# Patient Record
Sex: Female | Born: 1945 | Race: White | Hispanic: No | Marital: Married | Smoking: Former smoker
Health system: Southern US, Community
[De-identification: ages and names within clinical notes are randomized; demographics above are authoritative.]

## PROBLEM LIST (undated history)

## (undated) DIAGNOSIS — Z9889 Other specified postprocedural states: Secondary | ICD-10-CM

## (undated) DIAGNOSIS — Z923 Personal history of irradiation: Secondary | ICD-10-CM

## (undated) DIAGNOSIS — R112 Nausea with vomiting, unspecified: Secondary | ICD-10-CM

## (undated) DIAGNOSIS — Z860100 Personal history of colon polyps, unspecified: Secondary | ICD-10-CM

## (undated) DIAGNOSIS — Z87442 Personal history of urinary calculi: Secondary | ICD-10-CM

## (undated) DIAGNOSIS — Z803 Family history of malignant neoplasm of breast: Secondary | ICD-10-CM

## (undated) DIAGNOSIS — M2550 Pain in unspecified joint: Secondary | ICD-10-CM

## (undated) DIAGNOSIS — D0512 Intraductal carcinoma in situ of left breast: Secondary | ICD-10-CM

## (undated) DIAGNOSIS — M2540 Effusion, unspecified joint: Secondary | ICD-10-CM

## (undated) DIAGNOSIS — E039 Hypothyroidism, unspecified: Secondary | ICD-10-CM

## (undated) DIAGNOSIS — M1990 Unspecified osteoarthritis, unspecified site: Secondary | ICD-10-CM

## (undated) DIAGNOSIS — Z800 Family history of malignant neoplasm of digestive organs: Secondary | ICD-10-CM

## (undated) HISTORY — DX: Family history of malignant neoplasm of digestive organs: Z80.0

## (undated) HISTORY — DX: Family history of malignant neoplasm of breast: Z80.3

## (undated) HISTORY — DX: Intraductal carcinoma in situ of left breast: D05.12

## (undated) HISTORY — PX: TONSILLECTOMY: SUR1361

## (undated) HISTORY — PX: DILATION AND CURETTAGE OF UTERUS: SHX78

## (undated) HISTORY — PX: BREAST LUMPECTOMY: SHX2

---

## 2009-07-05 HISTORY — PX: KIDNEY DONATION: SHX685

## 2012-01-25 ENCOUNTER — Other Ambulatory Visit: Payer: Self-pay | Admitting: Internal Medicine

## 2012-01-25 DIAGNOSIS — Z1231 Encounter for screening mammogram for malignant neoplasm of breast: Secondary | ICD-10-CM

## 2012-02-03 ENCOUNTER — Ambulatory Visit
Admission: RE | Admit: 2012-02-03 | Discharge: 2012-02-03 | Disposition: A | Discharge status: Home / Self Care | Payer: Medicare Other | Source: Ambulatory Visit | Attending: Internal Medicine | Admitting: Internal Medicine

## 2012-02-03 DIAGNOSIS — Z1231 Encounter for screening mammogram for malignant neoplasm of breast: Secondary | ICD-10-CM

## 2012-02-04 ENCOUNTER — Other Ambulatory Visit: Payer: Self-pay | Admitting: Internal Medicine

## 2012-02-04 DIAGNOSIS — R928 Other abnormal and inconclusive findings on diagnostic imaging of breast: Secondary | ICD-10-CM

## 2013-10-10 ENCOUNTER — Other Ambulatory Visit: Payer: Self-pay

## 2013-10-10 DIAGNOSIS — Z1231 Encounter for screening mammogram for malignant neoplasm of breast: Secondary | ICD-10-CM

## 2013-10-31 ENCOUNTER — Ambulatory Visit
Admission: RE | Admit: 2013-10-31 | Discharge: 2013-10-31 | Disposition: A | Discharge status: Home / Self Care | Payer: Medicare Other | Source: Ambulatory Visit

## 2013-10-31 DIAGNOSIS — Z1231 Encounter for screening mammogram for malignant neoplasm of breast: Secondary | ICD-10-CM

## 2013-11-08 ENCOUNTER — Other Ambulatory Visit: Payer: Self-pay | Admitting: Internal Medicine

## 2013-11-08 DIAGNOSIS — R928 Other abnormal and inconclusive findings on diagnostic imaging of breast: Secondary | ICD-10-CM

## 2013-12-08 ENCOUNTER — Ambulatory Visit
Admission: RE | Admit: 2013-12-08 | Discharge: 2013-12-08 | Disposition: A | Discharge status: Home / Self Care | Payer: Medicare Other | Source: Ambulatory Visit | Attending: Internal Medicine | Admitting: Internal Medicine

## 2013-12-08 DIAGNOSIS — R928 Other abnormal and inconclusive findings on diagnostic imaging of breast: Secondary | ICD-10-CM

## 2014-02-14 ENCOUNTER — Other Ambulatory Visit: Payer: Self-pay | Admitting: Gastroenterology

## 2014-04-30 ENCOUNTER — Encounter (HOSPITAL_COMMUNITY): Payer: Self-pay | Admitting: Pharmacy Technician

## 2014-05-10 ENCOUNTER — Encounter (HOSPITAL_COMMUNITY): Payer: Self-pay | Admitting: *Deleted

## 2014-05-21 ENCOUNTER — Other Ambulatory Visit: Payer: Self-pay | Admitting: Gastroenterology

## 2014-05-22 ENCOUNTER — Encounter (HOSPITAL_COMMUNITY)
Admission: RE | Disposition: A | Discharge status: Home / Self Care | Payer: Self-pay | Source: Ambulatory Visit | Attending: Gastroenterology

## 2014-05-22 SURGERY — COLONOSCOPY WITH PROPOFOL
Anesthesia: Monitor Anesthesia Care

## 2014-05-22 MED ORDER — PROPOFOL 10 MG/ML IV BOLUS
INTRAVENOUS | Status: AC
  Filled 2014-05-22: qty 20

## 2014-05-22 MED ORDER — LIDOCAINE HCL (CARDIAC) 20 MG/ML IV SOLN
INTRAVENOUS | Status: AC
  Filled 2014-05-22: qty 5

## 2014-05-22 NOTE — Anesthesia Preprocedure Evaluation (Addendum)
Anesthesia Evaluation  Patient identified by MRN, date of birth, ID band Patient awake    Reviewed: Allergy & Precautions, H&P , NPO status , Patient's Chart, lab work & pertinent test results  History of Anesthesia Complications Negative for: history of anesthetic complications  Airway Mallampati: II  TM Distance: >3 FB Neck ROM: Full    Dental no notable dental hx.    Pulmonary neg pulmonary ROS,  breath sounds clear to auscultation  Pulmonary exam normal       Cardiovascular negative cardio ROS  Rhythm:Regular Rate:Normal     Neuro/Psych negative neurological ROS  negative psych ROS   GI/Hepatic negative GI ROS, Neg liver ROS,   Endo/Other  Hypothyroidism   Renal/GU negative Renal ROS  negative genitourinary   Musculoskeletal  (+) Arthritis -, Osteoarthritis,    Abdominal   Peds negative pediatric ROS (+)  Hematology negative hematology ROS (+)   Anesthesia Other Findings   Reproductive/Obstetrics negative OB ROS                             Anesthesia Physical Anesthesia Plan  ASA: II  Anesthesia Plan: MAC   Post-op Pain Management:    Induction: Intravenous  Airway Management Planned: Nasal Cannula  Additional Equipment:   Intra-op Plan:   Post-operative Plan:   Informed Consent: I have reviewed the patients History and Physical, chart, labs and discussed the procedure including the risks, benefits and alternatives for the proposed anesthesia with the patient or authorized representative who has indicated his/her understanding and acceptance.   Dental advisory given  Plan Discussed with: CRNA and Surgeon  Anesthesia Plan Comments:         Anesthesia Quick Evaluation

## 2014-08-15 ENCOUNTER — Encounter (HOSPITAL_COMMUNITY): Payer: Self-pay | Admitting: *Deleted

## 2014-09-04 ENCOUNTER — Encounter (HOSPITAL_COMMUNITY): Payer: Self-pay | Admitting: Gastroenterology

## 2014-09-04 ENCOUNTER — Ambulatory Visit (HOSPITAL_COMMUNITY): Payer: Medicare Other | Admitting: Registered Nurse

## 2014-09-04 ENCOUNTER — Encounter (HOSPITAL_COMMUNITY)
Admission: RE | Disposition: A | Discharge status: Home / Self Care | Payer: Self-pay | Source: Ambulatory Visit | Attending: Gastroenterology

## 2014-09-04 ENCOUNTER — Ambulatory Visit (HOSPITAL_COMMUNITY)
Admission: RE | Admit: 2014-09-04 | Discharge: 2014-09-04 | Disposition: A | Discharge status: Home / Self Care | Payer: Medicare Other | Source: Ambulatory Visit | Attending: Gastroenterology | Admitting: Gastroenterology

## 2014-09-04 ENCOUNTER — Other Ambulatory Visit: Payer: Self-pay | Admitting: Gastroenterology

## 2014-09-04 DIAGNOSIS — Z1211 Encounter for screening for malignant neoplasm of colon: Secondary | ICD-10-CM | POA: Diagnosis present

## 2014-09-04 DIAGNOSIS — E039 Hypothyroidism, unspecified: Secondary | ICD-10-CM | POA: Insufficient documentation

## 2014-09-04 DIAGNOSIS — M1990 Unspecified osteoarthritis, unspecified site: Secondary | ICD-10-CM | POA: Insufficient documentation

## 2014-09-04 DIAGNOSIS — Z86010 Personal history of colonic polyps: Secondary | ICD-10-CM | POA: Diagnosis not present

## 2014-09-04 HISTORY — DX: Unspecified osteoarthritis, unspecified site: M19.90

## 2014-09-04 HISTORY — DX: Hypothyroidism, unspecified: E03.9

## 2014-09-04 HISTORY — PX: COLONOSCOPY WITH PROPOFOL: SHX5780

## 2014-09-04 SURGERY — COLONOSCOPY WITH PROPOFOL
Anesthesia: Monitor Anesthesia Care

## 2014-09-04 MED ORDER — FENTANYL CITRATE 0.05 MG/ML IJ SOLN
INTRAMUSCULAR | Status: DC | PRN
  Administered 2014-09-04: 100 ug via INTRAVENOUS

## 2014-09-04 MED ORDER — PROPOFOL INFUSION 10 MG/ML OPTIME
INTRAVENOUS | Status: DC | PRN
  Administered 2014-09-04: 140 ug/kg/min via INTRAVENOUS

## 2014-09-04 MED ORDER — LACTATED RINGERS IV SOLN
INTRAVENOUS | Status: DC
  Administered 2014-09-04: 1000 mL via INTRAVENOUS

## 2014-09-04 MED ORDER — KETAMINE HCL 10 MG/ML IJ SOLN
INTRAMUSCULAR | Status: DC | PRN
  Administered 2014-09-04: 10 mg via INTRAVENOUS

## 2014-09-04 MED ORDER — NALOXONE HCL 0.4 MG/ML IJ SOLN
INTRAMUSCULAR | Status: DC | PRN
Start: 2014-09-04 — End: 2014-09-04
  Administered 2014-09-04: .08 mg via INTRAVENOUS

## 2014-09-04 MED ORDER — FENTANYL CITRATE 0.05 MG/ML IJ SOLN
INTRAMUSCULAR | Status: AC
  Filled 2014-09-04: qty 2

## 2014-09-04 SURGICAL SUPPLY — 21 items

## 2014-09-04 NOTE — Op Note (Signed)
Procedure: Surveillance colonoscopy. Colonoscopy with removal of 2 small adenomatous colon polyps performed on 05/13/2009  Endoscopist: Earle Gell  Premedication: Propofol administered by anesthesia  Procedure: The patient was placed in the left lateral decubitus position. Anal inspection and digital rectal exam were normal. The Pentax pediatric colonoscope was introduced into the rectum and advanced to the cecum. A normal-appearing appendiceal orifice was identified. A normal-appearing ileocecal valve was identified. Colonic preparation for the exam today was good. Withdrawal time was 11 minutes  Rectum. Normal. Retroflexed view of the distal rectum normal  Sigmoid colon and descending colon. Normal  Splenic flexure. Normal  Transverse colon. Normal  Hepatic flexure. Normal  Ascending colon. Normal  Cecum and ileocecal valve. Normal  Assessment: Normal surveillance colonoscopy  Recommendation: Schedule repeat surveillance colonoscopy in 5 years

## 2014-09-04 NOTE — Transfer of Care (Signed)
Immediate Anesthesia Transfer of Care Note  Patient: Barbara Ferguson  Procedure(s) Performed: Procedure(s): COLONOSCOPY WITH PROPOFOL (N/A)  Patient Location: PACU and Endoscopy Unit  Anesthesia Type:MAC  Level of Consciousness: awake, alert  and oriented  Airway & Oxygen Therapy: Patient Spontanous Breathing  Post-op Assessment: Report given to PACU RN and Post -op Vital signs reviewed and stable  Post vital signs: Reviewed and stable  Complications: No apparent anesthesia complications

## 2014-09-04 NOTE — H&P (Signed)
  Procedure: Surveillance colonoscopy. Colonoscopy with removal of 2 small adenomatous colon polyps performed on 05/13/2009  History: The patient is a 68 year old female born June 09, 1946. She is scheduled to undergo surveillance colonoscopy with polypectomy to prevent colon cancer.  Past medical history: Tonsillectomy. 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 Hypothyroidism.  Exam: The patient is alert and lying comfortably on the endoscopy stretcher. Abdomen is soft and nontender to palpation. Lungs are clear to auscultation. Cardiac exam reveals a regular rhythm.  Plan: Proceed with surveillance colonoscopy

## 2014-09-04 NOTE — Anesthesia Postprocedure Evaluation (Signed)
  Anesthesia Post-op Note  Patient: Barbara Ferguson  Procedure(s) Performed: Procedure(s) (LRB): COLONOSCOPY WITH PROPOFOL (N/A)  Patient Location: PACU  Anesthesia Type: MAC  Level of Consciousness: awake and alert   Airway and Oxygen Therapy: Patient Spontanous Breathing  Post-op Pain: mild  Post-op Assessment: Post-op Vital signs reviewed, Patient's Cardiovascular Status Stable, Respiratory Function Stable, Patent Airway and No signs of Nausea or vomiting  Last Vitals:  Filed Vitals:   09/04/14 0900  BP: 118/71  Pulse: 53  Temp:   Resp: 13    Post-op Vital Signs: stable   Complications: No apparent anesthesia complications

## 2014-09-05 ENCOUNTER — Encounter (HOSPITAL_COMMUNITY): Payer: Self-pay | Admitting: Gastroenterology

## 2015-12-10 ENCOUNTER — Other Ambulatory Visit: Payer: Self-pay | Admitting: Orthopedic Surgery

## 2016-01-08 NOTE — Pre-Procedure Instructions (Signed)
    Ashai Casablanca  01/08/2016      [id number] - [place], [place] AT [facility] [address] [place] [address] Phone: [phone] Fax: [phone]    Your procedure is scheduled on 01/20/16.  Report to [facility] Admitting at 530 A.M.  Call this number if you have problems the morning of surgery:  [phone]   Remember:  Do not eat food or drink liquids after midnight.  Take these medicines the morning of surgery with A SIP OF WATER --synthroid   Do not wear jewelry, make-up or nail polish.  Do not wear lotions, powders, or perfumes.  You may wear deodorant.  Do not shave 48 hours prior to surgery.  Men may shave face and neck.  Do not bring valuables to the hospital.  [facility] is not responsible for any belongings or valuables.  Contacts, dentures or bridgework may not be worn into surgery.  Leave your suitcase in the car.  After surgery it may be brought to your room.  For patients admitted to the hospital, discharge time will be determined by your treatment team.  Patients discharged the day of surgery will not be allowed to drive home.   Name and phone number of your driver:   Special instructions:   Please read over the following fact sheets that you were given. Pain Booklet, Coughing and Deep Breathing, Blood Transfusion Information, MRSA Information and Surgical Site Infection Prevention

## 2016-01-09 ENCOUNTER — Encounter (HOSPITAL_COMMUNITY): Payer: Self-pay

## 2016-01-09 ENCOUNTER — Encounter (HOSPITAL_COMMUNITY)
Admission: RE | Admit: 2016-01-09 | Discharge: 2016-01-09 | Disposition: A | Discharge status: Home / Self Care | Payer: Medicare Other | Source: Ambulatory Visit | Attending: Orthopedic Surgery | Admitting: Orthopedic Surgery

## 2016-01-09 ENCOUNTER — Ambulatory Visit (HOSPITAL_COMMUNITY)
Admission: RE | Admit: 2016-01-09 | Discharge: 2016-01-09 | Disposition: A | Discharge status: Home / Self Care | Payer: Medicare Other | Source: Ambulatory Visit | Attending: Orthopedic Surgery | Admitting: Orthopedic Surgery

## 2016-01-09 DIAGNOSIS — Z01812 Encounter for preprocedural laboratory examination: Secondary | ICD-10-CM | POA: Diagnosis not present

## 2016-01-09 DIAGNOSIS — Z01818 Encounter for other preprocedural examination: Secondary | ICD-10-CM

## 2016-01-09 DIAGNOSIS — M1712 Unilateral primary osteoarthritis, left knee: Secondary | ICD-10-CM | POA: Diagnosis not present

## 2016-01-09 LAB — COMPREHENSIVE METABOLIC PANEL
ALK PHOS: 64 U/L (ref 38–126)
ALT: 20 U/L (ref 14–54)
ANION GAP: 12 (ref 5–15)
AST: 23 U/L (ref 15–41)
Albumin: 4.4 g/dL (ref 3.5–5.0)
BILIRUBIN TOTAL: 0.8 mg/dL (ref 0.3–1.2)
BUN: 16 mg/dL (ref 6–20)
CALCIUM: 10.1 mg/dL (ref 8.9–10.3)
CO2: 22 mmol/L (ref 22–32)
Chloride: 104 mmol/L (ref 101–111)
Creatinine, Ser: 1.15 mg/dL — ABNORMAL HIGH (ref 0.44–1.00)
GFR calc Af Amer: 54 mL/min — ABNORMAL LOW (ref >60)
GFR, EST NON AFRICAN AMERICAN: 47 mL/min — AB (ref >60)
Glucose, Bld: 89 mg/dL (ref 65–99)
POTASSIUM: 4.1 mmol/L (ref 3.5–5.1)
Sodium: 138 mmol/L (ref 135–145)
TOTAL PROTEIN: 7 g/dL (ref 6.5–8.1)

## 2016-01-09 LAB — URINALYSIS, ROUTINE W REFLEX MICROSCOPIC
BILIRUBIN URINE: NEGATIVE
Glucose, UA: NEGATIVE mg/dL
HGB URINE DIPSTICK: NEGATIVE
KETONES UR: NEGATIVE mg/dL
Leukocytes, UA: NEGATIVE
NITRITE: NEGATIVE
PH: 6.5 (ref 5.0–8.0)
Protein, ur: NEGATIVE mg/dL
SPECIFIC GRAVITY, URINE: 1.013 (ref 1.005–1.030)

## 2016-01-09 LAB — CBC WITH DIFFERENTIAL/PLATELET
Basophils Absolute: 0.1 10*3/uL (ref 0.0–0.1)
Basophils Relative: 1 %
Eosinophils Absolute: 0.1 10*3/uL (ref 0.0–0.7)
Eosinophils Relative: 2 %
HEMATOCRIT: 40.4 % (ref 36.0–46.0)
HEMOGLOBIN: 13.6 g/dL (ref 12.0–15.0)
LYMPHS PCT: 19 %
Lymphs Abs: 1.2 10*3/uL (ref 0.7–4.0)
MCH: 29.7 pg (ref 26.0–34.0)
MCHC: 33.7 g/dL (ref 30.0–36.0)
MCV: 88.2 fL (ref 78.0–100.0)
MONO ABS: 0.7 10*3/uL (ref 0.1–1.0)
MONOS PCT: 11 %
NEUTROS ABS: 4.5 10*3/uL (ref 1.7–7.7)
Neutrophils Relative %: 67 %
Platelets: 285 10*3/uL (ref 150–400)
RBC: 4.58 MIL/uL (ref 3.87–5.11)
RDW: 14.2 % (ref 11.5–15.5)
WBC: 6.6 10*3/uL (ref 4.0–10.5)

## 2016-01-09 LAB — APTT: aPTT: 31 seconds (ref 24–37)

## 2016-01-09 LAB — PROTIME-INR
INR: 1.04 (ref 0.00–1.49)
PROTHROMBIN TIME: 13.8 s (ref 11.6–15.2)

## 2016-01-09 LAB — SURGICAL PCR SCREEN
MRSA, PCR: NEGATIVE
STAPHYLOCOCCUS AUREUS: NEGATIVE

## 2016-01-10 LAB — URINE CULTURE: Culture: 1000 — AB

## 2016-01-17 MED ORDER — BUPIVACAINE LIPOSOME 1.3 % IJ SUSP
20.0000 mL | INTRAMUSCULAR | Status: AC
  Administered 2016-01-20: 20 mL
  Filled 2016-01-17: qty 20

## 2016-01-17 MED ORDER — CEFAZOLIN SODIUM-DEXTROSE 2-4 GM/100ML-% IV SOLN
2.0000 g | INTRAVENOUS | Status: AC
  Administered 2016-01-20: 2 g via INTRAVENOUS
  Filled 2016-01-17: qty 100

## 2016-01-17 MED ORDER — SODIUM CHLORIDE 0.9 % IV SOLN
1000.0000 mg | INTRAVENOUS | Status: AC
  Administered 2016-01-20: 1000 mg via INTRAVENOUS
  Filled 2016-01-17: qty 10

## 2016-01-17 MED ORDER — ACETAMINOPHEN 500 MG PO TABS
1000.0000 mg | ORAL_TABLET | Freq: Once | ORAL | Status: AC
  Administered 2016-01-20: 1000 mg via ORAL
  Filled 2016-01-17: qty 2

## 2016-01-19 NOTE — Anesthesia Preprocedure Evaluation (Addendum)
Anesthesia Evaluation  Patient identified by MRN, date of birth, ID band Patient awake    Reviewed: Allergy & Precautions, NPO status , Patient's Chart, lab work & pertinent test results  History of Anesthesia Complications Negative for: history of anesthetic complications  Airway Mallampati: I  TM Distance: >3 FB Neck ROM: Full    Dental  (+) Teeth Intact, Dental Advisory Given   Pulmonary neg pulmonary ROS,    Pulmonary exam normal        Cardiovascular negative cardio ROS Normal cardiovascular exam     Neuro/Psych negative neurological ROS  negative psych ROS   GI/Hepatic negative GI ROS, Neg liver ROS,   Endo/Other  Hypothyroidism   Renal/GU Renal InsufficiencyRenal disease     Musculoskeletal   Abdominal   Peds  Hematology   Anesthesia Other Findings   Reproductive/Obstetrics                            Anesthesia Physical Anesthesia Plan  ASA: III  Anesthesia Plan: MAC and Spinal   Post-op Pain Management:    Induction:   Airway Management Planned: Simple Face Mask  Additional Equipment:   Intra-op Plan:   Post-operative Plan:   Informed Consent: I have reviewed the patients History and Physical, chart, labs and discussed the procedure including the risks, benefits and alternatives for the proposed anesthesia with the patient or authorized representative who has indicated his/her understanding and acceptance.   Dental advisory given  Plan Discussed with: CRNA, Anesthesiologist and Surgeon  Anesthesia Plan Comments:        Anesthesia Quick Evaluation

## 2016-01-20 ENCOUNTER — Encounter (HOSPITAL_COMMUNITY)
Admission: RE | Disposition: A | Discharge status: Home Health | Payer: Self-pay | Source: Ambulatory Visit | Attending: Orthopedic Surgery

## 2016-01-20 ENCOUNTER — Inpatient Hospital Stay (HOSPITAL_COMMUNITY): Payer: Medicare Other | Admitting: Anesthesiology

## 2016-01-20 ENCOUNTER — Encounter (HOSPITAL_COMMUNITY): Payer: Self-pay | Admitting: *Deleted

## 2016-01-20 ENCOUNTER — Inpatient Hospital Stay (HOSPITAL_COMMUNITY)
Admission: RE | Admit: 2016-01-20 | Discharge: 2016-01-21 | DRG: 470 | Disposition: A | Discharge status: Home Health | Payer: Medicare Other | Source: Ambulatory Visit | Attending: Orthopedic Surgery | Admitting: Orthopedic Surgery

## 2016-01-20 DIAGNOSIS — E039 Hypothyroidism, unspecified: Secondary | ICD-10-CM | POA: Diagnosis present

## 2016-01-20 DIAGNOSIS — Z96659 Presence of unspecified artificial knee joint: Secondary | ICD-10-CM

## 2016-01-20 DIAGNOSIS — Z905 Acquired absence of kidney: Secondary | ICD-10-CM

## 2016-01-20 DIAGNOSIS — Z79899 Other long term (current) drug therapy: Secondary | ICD-10-CM | POA: Diagnosis not present

## 2016-01-20 DIAGNOSIS — D62 Acute posthemorrhagic anemia: Secondary | ICD-10-CM | POA: Diagnosis not present

## 2016-01-20 DIAGNOSIS — M1712 Unilateral primary osteoarthritis, left knee: Principal | ICD-10-CM | POA: Diagnosis present

## 2016-01-20 DIAGNOSIS — N189 Chronic kidney disease, unspecified: Secondary | ICD-10-CM | POA: Diagnosis present

## 2016-01-20 HISTORY — PX: TOTAL KNEE ARTHROPLASTY: SHX125

## 2016-01-20 LAB — CBC
HEMATOCRIT: 35.4 % — AB (ref 36.0–46.0)
HEMOGLOBIN: 11.7 g/dL — AB (ref 12.0–15.0)
MCH: 29.3 pg (ref 26.0–34.0)
MCHC: 33.1 g/dL (ref 30.0–36.0)
MCV: 88.7 fL (ref 78.0–100.0)
Platelets: 240 10*3/uL (ref 150–400)
RBC: 3.99 MIL/uL (ref 3.87–5.11)
RDW: 13.9 % (ref 11.5–15.5)
WBC: 4.6 10*3/uL (ref 4.0–10.5)

## 2016-01-20 LAB — CREATININE, SERUM
Creatinine, Ser: 1.15 mg/dL — ABNORMAL HIGH (ref 0.44–1.00)
GFR, EST AFRICAN AMERICAN: 54 mL/min — AB (ref >60)
GFR, EST NON AFRICAN AMERICAN: 47 mL/min — AB (ref >60)

## 2016-01-20 SURGERY — ARTHROPLASTY, KNEE, TOTAL
Anesthesia: Monitor Anesthesia Care | Laterality: Left

## 2016-01-20 MED ORDER — MENTHOL 3 MG MT LOZG: 1.0000 | LOZENGE | OROMUCOSAL | Status: DC | PRN

## 2016-01-20 MED ORDER — ROCURONIUM BROMIDE 50 MG/5ML IV SOLN
INTRAVENOUS | Status: AC
  Filled 2016-01-20: qty 1

## 2016-01-20 MED ORDER — PROPOFOL 10 MG/ML IV BOLUS
INTRAVENOUS | Status: AC
  Filled 2016-01-20: qty 20

## 2016-01-20 MED ORDER — OXYCODONE HCL ER 10 MG PO T12A
10.0000 mg | EXTENDED_RELEASE_TABLET | Freq: Two times a day (BID) | ORAL | Status: DC
Start: 2016-01-20 — End: 2016-01-21
  Administered 2016-01-20 – 2016-01-21 (×3): 10 mg via ORAL
  Filled 2016-01-20 (×3): qty 1

## 2016-01-20 MED ORDER — HYDROMORPHONE HCL 1 MG/ML IJ SOLN
0.5000 mg | INTRAMUSCULAR | Status: DC | PRN
  Administered 2016-01-21: 0.5 mg via INTRAVENOUS
  Filled 2016-01-20: qty 1

## 2016-01-20 MED ORDER — PHENOL 1.4 % MT LIQD: 1.0000 | OROMUCOSAL | Status: DC | PRN

## 2016-01-20 MED ORDER — LIDOCAINE HCL (CARDIAC) 20 MG/ML IV SOLN
INTRAVENOUS | Status: AC
  Filled 2016-01-20: qty 5

## 2016-01-20 MED ORDER — LIDOCAINE HCL (CARDIAC) 20 MG/ML IV SOLN
INTRAVENOUS | Status: DC | PRN
  Administered 2016-01-20: 60 mg via INTRAVENOUS

## 2016-01-20 MED ORDER — SODIUM CHLORIDE 0.9 % IJ SOLN
INTRAMUSCULAR | Status: DC | PRN
  Administered 2016-01-20: 20 mL via INTRAVENOUS

## 2016-01-20 MED ORDER — PROPOFOL 10 MG/ML IV BOLUS
INTRAVENOUS | Status: DC | PRN
  Administered 2016-01-20 (×3): 20 mg via INTRAVENOUS

## 2016-01-20 MED ORDER — MIDAZOLAM HCL 2 MG/2ML IJ SOLN
INTRAMUSCULAR | Status: DC | PRN
  Administered 2016-01-20 (×2): 1 mg via INTRAVENOUS

## 2016-01-20 MED ORDER — EPHEDRINE SULFATE 50 MG/ML IJ SOLN
INTRAMUSCULAR | Status: AC
  Filled 2016-01-20: qty 1

## 2016-01-20 MED ORDER — METHOCARBAMOL 500 MG PO TABS
500.0000 mg | ORAL_TABLET | Freq: Four times a day (QID) | ORAL | Status: DC | PRN
  Administered 2016-01-20 – 2016-01-21 (×3): 500 mg via ORAL
  Filled 2016-01-20 (×3): qty 1

## 2016-01-20 MED ORDER — SODIUM CHLORIDE 0.9 % IV SOLN
1000.0000 mg | Freq: Once | INTRAVENOUS | Status: AC
  Administered 2016-01-20: 1000 mg via INTRAVENOUS
  Filled 2016-01-20: qty 10

## 2016-01-20 MED ORDER — LACTATED RINGERS IV SOLN
INTRAVENOUS | Status: DC | PRN
  Administered 2016-01-20 (×2): via INTRAVENOUS

## 2016-01-20 MED ORDER — DOCUSATE SODIUM 100 MG PO CAPS
100.0000 mg | ORAL_CAPSULE | Freq: Two times a day (BID) | ORAL | Status: DC
  Administered 2016-01-20 – 2016-01-21 (×2): 100 mg via ORAL
  Filled 2016-01-20 (×2): qty 1

## 2016-01-20 MED ORDER — FLEET ENEMA 7-19 GM/118ML RE ENEM: 1.0000 | ENEMA | Freq: Once | RECTAL | Status: DC | PRN

## 2016-01-20 MED ORDER — ENOXAPARIN SODIUM 30 MG/0.3ML ~~LOC~~ SOLN
30.0000 mg | Freq: Two times a day (BID) | SUBCUTANEOUS | Status: DC
  Administered 2016-01-20 – 2016-01-21 (×2): 30 mg via SUBCUTANEOUS
  Filled 2016-01-20 (×2): qty 0.3

## 2016-01-20 MED ORDER — ONDANSETRON HCL 4 MG/2ML IJ SOLN
4.0000 mg | Freq: Four times a day (QID) | INTRAMUSCULAR | Status: DC | PRN
  Administered 2016-01-20 – 2016-01-21 (×2): 4 mg via INTRAVENOUS
  Filled 2016-01-20 (×2): qty 2

## 2016-01-20 MED ORDER — DIPHENHYDRAMINE HCL 12.5 MG/5ML PO ELIX: 12.5000 mg | ORAL_SOLUTION | ORAL | Status: DC | PRN

## 2016-01-20 MED ORDER — CHLORHEXIDINE GLUCONATE 4 % EX LIQD: 60.0000 mL | Freq: Once | CUTANEOUS | Status: DC

## 2016-01-20 MED ORDER — HYDROMORPHONE HCL 1 MG/ML IJ SOLN: 0.2500 mg | INTRAMUSCULAR | Status: DC | PRN

## 2016-01-20 MED ORDER — BISACODYL 5 MG PO TBEC: 5.0000 mg | DELAYED_RELEASE_TABLET | Freq: Every day | ORAL | Status: DC | PRN

## 2016-01-20 MED ORDER — ONDANSETRON HCL 4 MG PO TABS: 4.0000 mg | ORAL_TABLET | Freq: Four times a day (QID) | ORAL | Status: DC | PRN

## 2016-01-20 MED ORDER — PROPOFOL 1000 MG/100ML IV EMUL
INTRAVENOUS | Status: AC
  Filled 2016-01-20: qty 300

## 2016-01-20 MED ORDER — ZOLPIDEM TARTRATE 5 MG PO TABS: 5.0000 mg | ORAL_TABLET | Freq: Every evening | ORAL | Status: DC | PRN

## 2016-01-20 MED ORDER — SUCCINYLCHOLINE CHLORIDE 20 MG/ML IJ SOLN
INTRAMUSCULAR | Status: AC
  Filled 2016-01-20: qty 1

## 2016-01-20 MED ORDER — FENTANYL CITRATE (PF) 250 MCG/5ML IJ SOLN
INTRAMUSCULAR | Status: AC
  Filled 2016-01-20: qty 5

## 2016-01-20 MED ORDER — ACETAMINOPHEN 650 MG RE SUPP: 650.0000 mg | Freq: Four times a day (QID) | RECTAL | Status: DC | PRN

## 2016-01-20 MED ORDER — SODIUM CHLORIDE 0.9 % IV SOLN: INTRAVENOUS | Status: DC

## 2016-01-20 MED ORDER — BUPIVACAINE-EPINEPHRINE (PF) 0.25% -1:200000 IJ SOLN
INTRAMUSCULAR | Status: DC | PRN
  Administered 2016-01-20: 30 mL via PERINEURAL

## 2016-01-20 MED ORDER — PHENYLEPHRINE HCL 10 MG/ML IJ SOLN
INTRAMUSCULAR | Status: DC | PRN
  Administered 2016-01-20 (×6): 40 ug via INTRAVENOUS

## 2016-01-20 MED ORDER — PROPOFOL 500 MG/50ML IV EMUL
INTRAVENOUS | Status: DC | PRN
Start: 2016-01-20 — End: 2016-01-20
  Administered 2016-01-20: 50 ug/kg/min via INTRAVENOUS

## 2016-01-20 MED ORDER — OXYCODONE HCL 5 MG PO TABS
5.0000 mg | ORAL_TABLET | ORAL | Status: DC | PRN
  Administered 2016-01-20: 10 mg via ORAL
  Administered 2016-01-20: 5 mg via ORAL
  Administered 2016-01-21 (×4): 10 mg via ORAL
  Filled 2016-01-20 (×4): qty 2
  Filled 2016-01-20: qty 1
  Filled 2016-01-20: qty 2

## 2016-01-20 MED ORDER — METOCLOPRAMIDE HCL 5 MG/ML IJ SOLN: 5.0000 mg | Freq: Three times a day (TID) | INTRAMUSCULAR | Status: DC | PRN

## 2016-01-20 MED ORDER — ALUM & MAG HYDROXIDE-SIMETH 200-200-20 MG/5ML PO SUSP: 30.0000 mL | ORAL | Status: DC | PRN

## 2016-01-20 MED ORDER — BUPIVACAINE-EPINEPHRINE (PF) 0.25% -1:200000 IJ SOLN
INTRAMUSCULAR | Status: AC
  Filled 2016-01-20: qty 30

## 2016-01-20 MED ORDER — CEFAZOLIN SODIUM 1-5 GM-% IV SOLN
1.0000 g | Freq: Four times a day (QID) | INTRAVENOUS | Status: AC
  Administered 2016-01-20: 1 g via INTRAVENOUS
  Filled 2016-01-20 (×2): qty 50

## 2016-01-20 MED ORDER — BUPIVACAINE IN DEXTROSE 0.75-8.25 % IT SOLN
INTRATHECAL | Status: DC | PRN
  Administered 2016-01-20: 15 mg via INTRATHECAL

## 2016-01-20 MED ORDER — BUPIVACAINE-EPINEPHRINE (PF) 0.5% -1:200000 IJ SOLN
INTRAMUSCULAR | Status: AC
  Filled 2016-01-20: qty 30

## 2016-01-20 MED ORDER — SODIUM CHLORIDE 0.9 % IR SOLN
Status: DC | PRN
  Administered 2016-01-20 (×2): 1000 mL

## 2016-01-20 MED ORDER — CELECOXIB 200 MG PO CAPS
200.0000 mg | ORAL_CAPSULE | Freq: Two times a day (BID) | ORAL | Status: DC
  Administered 2016-01-20 – 2016-01-21 (×3): 200 mg via ORAL
  Filled 2016-01-20 (×3): qty 1

## 2016-01-20 MED ORDER — MIDAZOLAM HCL 2 MG/2ML IJ SOLN
INTRAMUSCULAR | Status: AC
  Filled 2016-01-20: qty 2

## 2016-01-20 MED ORDER — METOCLOPRAMIDE HCL 5 MG PO TABS: 5.0000 mg | ORAL_TABLET | Freq: Three times a day (TID) | ORAL | Status: DC | PRN

## 2016-01-20 MED ORDER — ACETAMINOPHEN 325 MG PO TABS: 650.0000 mg | ORAL_TABLET | Freq: Four times a day (QID) | ORAL | Status: DC | PRN

## 2016-01-20 MED ORDER — METHOCARBAMOL 1000 MG/10ML IJ SOLN
500.0000 mg | Freq: Four times a day (QID) | INTRAVENOUS | Status: DC | PRN
  Filled 2016-01-20: qty 5

## 2016-01-20 MED ORDER — SENNOSIDES-DOCUSATE SODIUM 8.6-50 MG PO TABS: 1.0000 | ORAL_TABLET | Freq: Every evening | ORAL | Status: DC | PRN

## 2016-01-20 MED ORDER — STERILE WATER FOR INJECTION IJ SOLN
INTRAMUSCULAR | Status: AC
  Filled 2016-01-20: qty 10

## 2016-01-20 MED ORDER — ENOXAPARIN SODIUM 30 MG/0.3ML ~~LOC~~ SOLN: 30.0000 mg | Freq: Two times a day (BID) | SUBCUTANEOUS | Status: DC

## 2016-01-20 MED ORDER — LEVOTHYROXINE SODIUM 75 MCG PO TABS
75.0000 ug | ORAL_TABLET | Freq: Every day | ORAL | Status: DC
  Administered 2016-01-21: 75 ug via ORAL
  Filled 2016-01-20: qty 1

## 2016-01-20 MED ORDER — EPHEDRINE SULFATE 50 MG/ML IJ SOLN
INTRAMUSCULAR | Status: DC | PRN
  Administered 2016-01-20: 10 mg via INTRAVENOUS
  Administered 2016-01-20: 5 mg via INTRAVENOUS

## 2016-01-20 SURGICAL SUPPLY — 58 items
BANDAGE ESMARK 6X9 LF (GAUZE/BANDAGES/DRESSINGS) ×1 IMPLANT
BLADE SAGITTAL 13X1.27X60 (BLADE) ×2 IMPLANT
BLADE SAGITTAL 13X1.27X60MM (BLADE) ×1
BLADE SAW SGTL 83.5X18.5 (BLADE) ×3 IMPLANT
BLADE SURG 10 STRL SS (BLADE) ×3 IMPLANT
BNDG ESMARK 6X9 LF (GAUZE/BANDAGES/DRESSINGS) ×3
BOWL SMART MIX CTS (DISPOSABLE) ×3 IMPLANT
CAPT KNEE TOTAL 3 ×3 IMPLANT
CEMENT BONE SIMPLEX SPEEDSET (Cement) ×6 IMPLANT
COVER SURGICAL LIGHT HANDLE (MISCELLANEOUS) ×3 IMPLANT
CUFF TOURNIQUET SINGLE 34IN LL (TOURNIQUET CUFF) ×3 IMPLANT
DRAPE EXTREMITY T 121X128X90 (DRAPE) ×3 IMPLANT
DRAPE INCISE IOBAN 66X45 STRL (DRAPES) ×6 IMPLANT
DRAPE PROXIMA HALF (DRAPES) IMPLANT
DRAPE U-SHAPE 47X51 STRL (DRAPES) ×3 IMPLANT
DRSG ADAPTIC 3X8 NADH LF (GAUZE/BANDAGES/DRESSINGS) ×3 IMPLANT
DRSG PAD ABDOMINAL 8X10 ST (GAUZE/BANDAGES/DRESSINGS) ×3 IMPLANT
DURAPREP 26ML APPLICATOR (WOUND CARE) ×6 IMPLANT
ELECT REM PT RETURN 9FT ADLT (ELECTROSURGICAL) ×3
ELECTRODE REM PT RTRN 9FT ADLT (ELECTROSURGICAL) ×1 IMPLANT
GAUZE SPONGE 4X4 12PLY STRL (GAUZE/BANDAGES/DRESSINGS) ×3 IMPLANT
GLOVE BIOGEL M 7.0 STRL (GLOVE) IMPLANT
GLOVE BIOGEL PI IND STRL 7.5 (GLOVE) IMPLANT
GLOVE BIOGEL PI IND STRL 8.5 (GLOVE) ×2 IMPLANT
GLOVE BIOGEL PI INDICATOR 7.5 (GLOVE)
GLOVE BIOGEL PI INDICATOR 8.5 (GLOVE) ×4
GLOVE SURG ORTHO 8.0 STRL STRW (GLOVE) ×9 IMPLANT
GOWN STRL REUS W/ TWL LRG LVL3 (GOWN DISPOSABLE) ×1 IMPLANT
GOWN STRL REUS W/ TWL XL LVL3 (GOWN DISPOSABLE) ×2 IMPLANT
GOWN STRL REUS W/TWL 2XL LVL3 (GOWN DISPOSABLE) IMPLANT
GOWN STRL REUS W/TWL LRG LVL3 (GOWN DISPOSABLE) ×2
GOWN STRL REUS W/TWL XL LVL3 (GOWN DISPOSABLE) ×4
HANDPIECE INTERPULSE COAX TIP (DISPOSABLE) ×2
HOOD PEEL AWAY FACE SHEILD DIS (HOOD) ×9 IMPLANT
KIT BASIN OR (CUSTOM PROCEDURE TRAY) ×3 IMPLANT
KIT ROOM TURNOVER OR (KITS) ×3 IMPLANT
KNEE CAPITATED TOTAL 3 ×1 IMPLANT
MANIFOLD NEPTUNE II (INSTRUMENTS) ×3 IMPLANT
NEEDLE 22X1 1/2 (OR ONLY) (NEEDLE) ×6 IMPLANT
NS IRRIG 1000ML POUR BTL (IV SOLUTION) ×3 IMPLANT
PACK TOTAL JOINT (CUSTOM PROCEDURE TRAY) ×3 IMPLANT
PACK UNIVERSAL I (CUSTOM PROCEDURE TRAY) IMPLANT
PAD ARMBOARD 7.5X6 YLW CONV (MISCELLANEOUS) ×6 IMPLANT
PADDING CAST COTTON 6X4 STRL (CAST SUPPLIES) ×3 IMPLANT
SET HNDPC FAN SPRY TIP SCT (DISPOSABLE) ×1 IMPLANT
STAPLER VISISTAT 35W (STAPLE) ×3 IMPLANT
SUCTION FRAZIER HANDLE 10FR (MISCELLANEOUS) ×2
SUCTION TUBE FRAZIER 10FR DISP (MISCELLANEOUS) ×1 IMPLANT
SUT BONE WAX W31G (SUTURE) ×3 IMPLANT
SUT VIC AB 0 CTB1 27 (SUTURE) ×6 IMPLANT
SUT VIC AB 1 CT1 27 (SUTURE) ×4
SUT VIC AB 1 CT1 27XBRD ANBCTR (SUTURE) ×2 IMPLANT
SUT VIC AB 2-0 CT1 27 (SUTURE) ×4
SUT VIC AB 2-0 CT1 TAPERPNT 27 (SUTURE) ×2 IMPLANT
SYR 20CC LL (SYRINGE) ×6 IMPLANT
TOWEL OR 17X24 6PK STRL BLUE (TOWEL DISPOSABLE) ×3 IMPLANT
TOWEL OR 17X26 10 PK STRL BLUE (TOWEL DISPOSABLE) ×3 IMPLANT
WATER STERILE IRR 1000ML POUR (IV SOLUTION) IMPLANT

## 2016-01-20 NOTE — H&P (Signed)
Barbara Ferguson MRN:  [id number] DOB/SEX:  09-16-1946/female  CHIEF COMPLAINT:  Painful left Knee  HISTORY: Patient is a 70 y.o. female presented with a history of pain in the left knee. Onset of symptoms was gradual starting a few years ago with gradually worsening course since that tim. Patient has been treated conservatively with over-the-counter NSAIDs and activity modification. Patient currently rates pain in the knee at 10 out of 10 with activity. There is pain at night.  PAST MEDICAL HISTORY: There are no active problems to display for this patient.  Past Medical History  Diagnosis Date  . Hypothyroidism   . Arthritis   . Anemia yrs ago, none recent  . 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 Kidney donation  oct 2010    left  . Colonoscopy with propofol N/A 09/04/2014    Procedure: COLONOSCOPY WITH PROPOFOL;  Surgeon: Garlan Fair, MD;  Location: [facility];  Service: Endoscopy;  Laterality: N/A;  . Tonsillectomy       MEDICATIONS:   Prescriptions prior to admission  Medication Sig Dispense Refill Last Dose  . Glucos-Chondroit-Hyaluron-MSM (GLUCOSAMINE CHONDROITIN JOINT PO) Take 1-2 tablets by mouth See admin instructions. Pt takes 2 tablet in the morning, 1 tablet in the evening     . levothyroxine (SYNTHROID, LEVOTHROID) 75 MCG tablet Take 75 mcg by mouth daily before breakfast.   09/03/2014 at 0530  . Multiple Vitamins-Minerals (MULTIVITAMIN & MINERAL PO) Take 1 tablet by mouth daily.       ALLERGIES:  No Known Allergies  REVIEW OF SYSTEMS:  A comprehensive review of systems was negative except for: Musculoskeletal: positive for arthralgias and stiff joints   FAMILY HISTORY:  No family history on file.  SOCIAL HISTORY:   Social History  Substance Use Topics  . Smoking status: Never Smoker   . Smokeless tobacco: Never Used  . Alcohol Use: Yes     Comment: wine 1 glass  daily     EXAMINATION:  Vital signs in last 24 hours:    There were no vitals taken for this visit. General appearance: alert, cooperative and no distress Head: Normocephalic, without obvious abnormality, atraumatic Lungs: clear to auscultation bilaterally Heart: regular rate and rhythm, S1, S2 normal, no murmur, click, rub or gallop Abdomen: soft, non-tender; bowel sounds normal; no masses,  no organomegaly Extremities: extremities normal, atraumatic, no cyanosis or edema Pulses: 2+ and symmetric Skin: Skin color, texture, turgor normal. No rashes or lesions  Musculoskeletal:  ROM 0-120, Ligaments intact,  Imaging Review Plain radiographs demonstrate severe degenerative joint disease of the left knee. The overall alignment is neutral. The bone quality appears to be excellent for age and reported activity level.  Assessment/Plan: Primary osteoarthritis, left knee   The patient history, physical examination and imaging studies are consistent with advanced degenerative joint disease of the left knee. The patient has failed conservative treatment.  The clearance notes were reviewed.  After discussion with the patient it was felt that Total Knee Replacement was indicated. The procedure,  risks, and benefits of total knee arthroplasty were presented and reviewed. The risks including but not limited to aseptic loosening, infection, blood clots, vascular injury, stiffness, patella tracking problems complications among others were discussed. The patient acknowledged the explanation, agreed to proceed with the plan.  [name] 01/20/2016, 6:32 AM

## 2016-01-20 NOTE — Anesthesia Procedure Notes (Signed)
Spinal Patient location during procedure: OR Start time: 01/20/2016 7:29 AM End time: 01/20/2016 7:38 AM Staffing Anesthesiologist: Duane Boston Performed by: anesthesiologist  Preanesthetic Checklist Completed: patient identified, surgical consent, pre-op evaluation, timeout performed, IV checked, risks and benefits discussed and monitors and equipment checked Spinal Block Patient position: sitting Prep: DuraPrep Patient monitoring: cardiac monitor, continuous pulse ox and blood pressure Approach: midline Location: L2-3 Injection technique: single-shot Needle Needle type: Pencan  Needle gauge: 24 G Needle length: 9 cm Additional Notes Functioning IV was confirmed and monitors were applied. Sterile prep and drape, including hand hygiene and sterile gloves were used. The patient was positioned and the spine was prepped. The skin was anesthetized with lidocaine.  Free flow of clear CSF was obtained prior to injecting local anesthetic into the CSF.  The spinal needle aspirated freely following injection.  The needle was carefully withdrawn.  The patient tolerated the procedure well.

## 2016-01-20 NOTE — Progress Notes (Signed)
Orthopedic Tech Progress Note Patient Details:  Barbara Ferguson [birth year] [id number]  CPM Left Knee CPM Left Knee: On Left Knee Flexion (Degrees): 90 Left Knee Extension (Degrees): 0 Additional Comments: Trapeze bar and foot roll   Barbara Ferguson 01/20/2016, 10:44 AM

## 2016-01-20 NOTE — Anesthesia Postprocedure Evaluation (Signed)
Anesthesia Post Note  Patient: Barbara Ferguson  Procedure(s) Performed: Procedure(s) (LRB): TOTAL KNEE ARTHROPLASTY (Left)  Patient location during evaluation: PACU Anesthesia Type: Spinal Level of consciousness: oriented and awake and alert Pain management: pain level controlled Vital Signs Assessment: post-procedure vital signs reviewed and stable Respiratory status: spontaneous breathing, respiratory function stable and patient connected to nasal cannula oxygen Cardiovascular status: blood pressure returned to baseline and stable Postop Assessment: no headache and no backache Anesthetic complications: no    Last Vitals:  Filed Vitals:   01/20/16 1117 01/20/16 1200  BP: 112/68   Pulse: 51 58  Temp:    Resp: 14     Last Pain:  Filed Vitals:   01/20/16 1308  PainSc: 5                  Srihan Brutus DANIEL

## 2016-01-20 NOTE — Progress Notes (Signed)
Orthopedic Tech Progress Note Patient Details:  Barbara Ferguson 1945/10/25 [id number]  CPM Left Knee CPM Left Knee: On Left Knee Flexion (Degrees): 90 Left Knee Extension (Degrees): 0 Additional Comments: Trapeze bar and foot roll   [place] Pink 01/20/2016, 3:29 PM

## 2016-01-20 NOTE — Evaluation (Signed)
Physical Therapy Evaluation Patient Details Name: Barbara Ferguson MRN: [id number] DOB: Feb 05, 1946 Today's Date: 01/20/2016   History of Present Illness  70 y.o. female now s/p Lt TKA. PMH: CKD, anemia.   Clinical Impression  Pt is s/p TKA resulting in the deficits listed below (see PT Problem List).  Pt will benefit from skilled PT to increase their independence and safety with mobility to allow discharge to home with spouse assistance. Pt reporting that she is anticipating D/C to home on 01/21/16.       Follow Up Recommendations Home health PT;Supervision for mobility/OOB    Equipment Recommendations  None recommended by PT    Recommendations for Other Services       Precautions / Restrictions Precautions Precautions: Knee;Fall Precaution Booklet Issued: Yes (comment) Precaution Comments: HEP provided, reviewed no pillows under knee.  Restrictions Weight Bearing Restrictions: Yes LLE Weight Bearing: Weight bearing as tolerated      Mobility  Bed Mobility Overal bed mobility: Needs Assistance Bed Mobility: Supine to Sit     Supine to sit: Min assist     General bed mobility comments: min assist with LLE  Transfers Overall transfer level: Needs assistance Equipment used: Rolling walker (2 wheeled) Transfers: Sit to/from Stand Sit to Stand: Min guard         General transfer comment: steady with initial stand, minimal weight bearing through LLE  Ambulation/Gait Ambulation/Gait assistance: Min guard Ambulation Distance (Feet): 10 Feet Assistive device: Rolling walker (2 wheeled) Gait Pattern/deviations: Step-to pattern;Decreased step length - left;Decreased weight shift to left Gait velocity: decreased   General Gait Details: increasing weight bearing with increasing confidence  Stairs            Wheelchair Mobility    Modified Rankin (Stroke Patients Only)       Balance Overall balance assessment: Needs assistance Sitting-balance support: No upper  extremity supported Sitting balance-Leahy Scale: Good     Standing balance support: Bilateral upper extremity supported Standing balance-Leahy Scale: Poor Standing balance comment: using rw                             Pertinent Vitals/Pain Pain Assessment: 0-10 Pain Score: 5  Pain Location: Lt knee Pain Descriptors / Indicators: Throbbing Pain Intervention(s): Limited activity within patient's tolerance;Monitored during session;Ice applied    Home Living Family/patient expects to be discharged to:: Private residence Living Arrangements: Spouse/significant other Available Help at Discharge: Family;Available 24 hours/day Type of Home: House Home Access: Level entry     Home Layout: One level Home Equipment: Walker - 2 wheels      Prior Function Level of Independence: Independent         Comments: 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     Hand Dominance        Extremity/Trunk Assessment               Lower Extremity Assessment: LLE deficits/detail   LLE Deficits / Details: fair quad activation, min assist with SLR     Communication   Communication: No difficulties  Cognition Arousal/Alertness: Awake/alert Behavior During Therapy: WFL for tasks assessed/performed Overall Cognitive Status: Within Functional Limits for tasks assessed                      General Comments      Exercises Total Joint Exercises Ankle Circles/Pumps: AROM;Both;10 reps Quad Sets: Strengthening;Left;10 reps Heel Slides: AAROM;Left;5 reps Goniometric ROM: approximately 90 degrees  flexion      Assessment/Plan    PT Assessment Patient needs continued PT services  PT Diagnosis Difficulty walking   PT Problem List Decreased strength;Decreased range of motion;Decreased activity tolerance;Decreased balance;Decreased mobility  PT Treatment Interventions DME instruction;Gait training;Stair training;Functional mobility training;Therapeutic  activities;Therapeutic exercise;Balance training;Patient/family education   PT Goals (Current goals can be found in the Care Plan section) Acute Rehab PT Goals Patient Stated Goal: go home and get back to being active PT Goal Formulation: With patient Time For Goal Achievement: 02/03/16 Potential to Achieve Goals: Good    Frequency 7X/week   Barriers to discharge        Co-evaluation               End of Session Equipment Utilized During Treatment: Gait belt Activity Tolerance: Patient limited by pain Patient left: in chair;with call bell/phone within reach (in knee extesion) Nurse Communication: Mobility status;Weight bearing status         Time: 1340-1411 PT Time Calculation (min) (ACUTE ONLY): 31 min   Charges:   PT Evaluation $PT Eval Moderate Complexity: 1 Procedure PT Treatments $Gait Training: 8-22 mins   PT G Codes:        Cassell Clement, PT, CSCS Pager [phone] Office [phone]  01/20/2016, 2:35 PM

## 2016-01-20 NOTE — Transfer of Care (Signed)
Immediate Anesthesia Transfer of Care Note  Patient: Barbara Ferguson  Procedure(s) Performed: Procedure(s): TOTAL KNEE ARTHROPLASTY (Left)  Patient Location: PACU  Anesthesia Type:Spinal  Level of Consciousness: awake, alert  and oriented  Airway & Oxygen Therapy: Patient Spontanous Breathing  Post-op Assessment: Report given to RN and Post -op Vital signs reviewed and stable  Post vital signs: Reviewed and stable  Last Vitals:  Filed Vitals:   01/20/16 0648  BP: 128/87  Pulse: 57  Temp: 36.4 C  Resp: 20    Complications: No apparent anesthesia complications

## 2016-01-21 ENCOUNTER — Encounter (HOSPITAL_COMMUNITY): Payer: Self-pay | Admitting: Orthopedic Surgery

## 2016-01-21 LAB — CBC
HEMATOCRIT: 34.6 % — AB (ref 36.0–46.0)
Hemoglobin: 11.6 g/dL — ABNORMAL LOW (ref 12.0–15.0)
MCH: 29.1 pg (ref 26.0–34.0)
MCHC: 33.5 g/dL (ref 30.0–36.0)
MCV: 86.9 fL (ref 78.0–100.0)
Platelets: 228 10*3/uL (ref 150–400)
RBC: 3.98 MIL/uL (ref 3.87–5.11)
RDW: 13.4 % (ref 11.5–15.5)
WBC: 11.7 10*3/uL — AB (ref 4.0–10.5)

## 2016-01-21 LAB — BASIC METABOLIC PANEL
ANION GAP: 9 (ref 5–15)
BUN: 9 mg/dL (ref 6–20)
CO2: 20 mmol/L — AB (ref 22–32)
Calcium: 8.9 mg/dL (ref 8.9–10.3)
Chloride: 99 mmol/L — ABNORMAL LOW (ref 101–111)
Creatinine, Ser: 1.07 mg/dL — ABNORMAL HIGH (ref 0.44–1.00)
GFR calc Af Amer: 59 mL/min — ABNORMAL LOW (ref >60)
GFR calc non Af Amer: 51 mL/min — ABNORMAL LOW (ref >60)
GLUCOSE: 133 mg/dL — AB (ref 65–99)
Potassium: 4.4 mmol/L (ref 3.5–5.1)
Sodium: 128 mmol/L — ABNORMAL LOW (ref 135–145)

## 2016-01-21 MED ORDER — OXYCODONE HCL ER 10 MG PO T12A: 10.0000 mg | EXTENDED_RELEASE_TABLET | Freq: Two times a day (BID) | ORAL | Status: DC

## 2016-01-21 MED ORDER — OXYCODONE HCL 5 MG PO TABS: 5.0000 mg | ORAL_TABLET | ORAL | Status: DC | PRN

## 2016-01-21 MED ORDER — ONDANSETRON HCL 4 MG PO TABS
4.0000 mg | ORAL_TABLET | Freq: Four times a day (QID) | ORAL | Status: DC | PRN
Start: 2016-01-21 — End: 2017-08-04

## 2016-01-21 MED ORDER — METHOCARBAMOL 500 MG PO TABS: 500.0000 mg | ORAL_TABLET | Freq: Four times a day (QID) | ORAL | Status: DC | PRN

## 2016-01-21 MED ORDER — ENOXAPARIN SODIUM 40 MG/0.4ML ~~LOC~~ SOLN: 40.0000 mg | SUBCUTANEOUS | Status: DC

## 2016-01-21 NOTE — Evaluation (Signed)
  Occupational Therapy Evaluation and Discharge Patient Details Name: Barbara Ferguson MRN: [id number] DOB: Sep 27, 1946 Today's Date: 01/21/2016    History of Present Illness 70 y.o. female now s/p Lt TKA. PMH: CKD, anemia.    Clinical Impression   This 70 yo female admitted with above presents to acute OT with all education completed with pt and husband. No further OT needs and pt to D/C later today.Acute OT will sign off.    Follow Up Recommendations  No OT follow up    Equipment Recommendations  None recommended by OT       Precautions / Restrictions Precautions Precautions: Knee;Fall Restrictions Weight Bearing Restrictions: No LLE Weight Bearing: Weight bearing as tolerated              ADL                                         General ADL Comments: Educated and wrote down for pt and husband the most efficient sequencing for dressing, and which leg to put in first for LBD. Educated on technique for shower stall transfers. Offered to have pt practice however she politely declined feeling that she and husband understood technique.               Pertinent Vitals/Pain Pain Assessment: 0-10 Pain Score: 3  Pain Location: left knee Pain Descriptors / Indicators: Aching;Sore Pain Intervention(s): Monitored during session     Hand Dominance Right   Extremity/Trunk Assessment Upper Extremity Assessment Upper Extremity Assessment: Overall WFL for tasks assessed           Communication Communication Communication: No difficulties   Cognition Arousal/Alertness: Lethargic;Suspect due to medications (husband present) Behavior During Therapy: WFL for tasks assessed/performed Overall Cognitive Status: Within Functional Limits for tasks assessed                                Home Living Family/patient expects to be discharged to:: Private residence Living Arrangements: Spouse/significant other Available Help at Discharge:  Family;Available 24 hours/day Type of Home: House Home Access: Level entry     Home Layout: One level     Bathroom Shower/Tub: Occupational psychologist: Standard     Home Equipment: Environmental consultant - 2 wheels;Shower seat - built in          Prior Functioning/Environment Level of Independence: Independent        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    OT Diagnosis: Generalized weakness;Acute pain         OT Goals(Current goals can be found in the care plan section) Acute Rehab OT Goals Patient Stated Goal: home today  OT Frequency:                End of Session Nurse Communication:  (NT: pt wanted to get in CPM )  Activity Tolerance: Patient limited by lethargy (suspect due to pain meds) Patient left: in bed;with call bell/phone within reach;with bed alarm set   Time: 1100-1115 OT Time Calculation (min): 15 min Charges:  OT General Charges $OT Visit: 1 Procedure OT Evaluation $OT Eval Moderate Complexity: 1 Procedure  Almon Register [id number] 01/21/2016, 3:06 PM

## 2016-01-21 NOTE — Progress Notes (Signed)
SPORTS MEDICINE AND JOINT REPLACEMENT  Lara Mulch, MD   [facility] [address], [place], [place]  [address]                             [phone]   PROGRESS NOTE  Subjective:  negative for Chest Pain  negative for Shortness of Breath  negative for Nausea/Vomiting   negative for Calf Pain  negative for Bowel Movement   Tolerating Diet: yes         Patient reports pain as 6 on 0-10 scale.    Objective: Vital signs in last 24 hours:   Patient Vitals for the past 24 hrs:  BP Temp Temp src Pulse Resp SpO2  01/21/16 0500 (!) 141/66 mmHg 98.1 F (36.7 C) Oral 89 20 99 %  01/21/16 0100 (!) 164/78 mmHg 97.7 F (36.5 C) Oral 67 20 100 %  01/20/16 2100 (!) 159/76 mmHg 97.9 F (36.6 C) Oral 63 18 100 %  01/20/16 1200 - - - (!) 58 - -  01/20/16 1117 112/68 mmHg - - (!) 51 14 100 %  01/20/16 1026 - (!) 96.8 F (36 C) - (!) 53 12 100 %  01/20/16 1017 134/75 mmHg - - (!) 55 16 100 %  01/20/16 1015 - - - (!) 58 18 100 %  01/20/16 1002 98/70 mmHg - - (!) 52 10 98 %  01/20/16 0946 99/61 mmHg - - (!) 50 12 100 %  01/20/16 0931 (!) 85/64 mmHg - - (!) 57 12 100 %  01/20/16 0930 - 97.2 F (36.2 C) - 64 (!) 21 100 %  01/20/16 0919 (!) 99/59 mmHg - - (!) 57 12 100 %  01/20/16 0915 - (!) 95.4 F (35.2 C) - (!) 56 17 100 %    @flow {1959:LAST@   Intake/Output from previous day:   04/17 0701 - 04/18 0700 In: 2020 [P.O.:720; I.V.:1200] Out: 100    Intake/Output this shift:       Intake/Output      04/17 0701 - 04/18 0700 04/18 0701 - 04/19 0700   P.O. 720    I.V. (mL/kg) 1200 (21.3)    IV Piggyback 100    Total Intake(mL/kg) 2020 (35.9)    Urine (mL/kg/hr) 0 (0)    Stool 0 (0)    Blood 100 (0.1)    Total Output 100     Net +1920          Urine Occurrence 4 x    Stool Occurrence 0 x       LABORATORY DATA:  Recent Labs  01/20/16 1047  WBC 4.6  HGB 11.7*  HCT 35.4*  PLT 240    Recent Labs  01/20/16 1047  CREATININE 1.15*   Lab Results   Component Value Date   INR 1.04 01/09/2016    Examination:    Wound Exam: clean, dry, intact   Drainage:  None: wound tissue dry  Motor Exam: Quadriceps and Hamstrings Intact  Sensory Exam: Superficial Peroneal, Deep Peroneal and Tibial normal   Assessment:    1 Day Post-Op  Procedure(s) (LRB): TOTAL KNEE ARTHROPLASTY (Left)  ADDITIONAL DIAGNOSIS:  Active Problems:   S/P total knee replacement  Acute Blood Loss Anemia   Plan: Physical Therapy as ordered Weight Bearing as Tolerated (WBAT)  DVT Prophylaxis:  Lovenox  DISCHARGE PLAN: Home  DISCHARGE NEEDS: HHPT   Patient looks great. Discharge once cleared from PT  [name] 01/21/2016, 7:05 AM

## 2016-01-21 NOTE — Progress Notes (Signed)
Physical Therapy Treatment Patient Details Name: Barbara Ferguson MRN: [id number] DOB: [birth year] Today's Date: 01/21/2016    History of Present Illness 70 y.o. female now s/p Lt TKA. PMH: CKD, anemia.     PT Comments    Patient is making good progress with PT.  From a mobility standpoint anticipate patient will be ready for DC home when medically ready.  Will continue to progress as tolerated until d/c home with HHPT.    Follow Up Recommendations  Home health PT;Supervision for mobility/OOB     Equipment Recommendations  None recommended by PT    Recommendations for Other Services       Precautions / Restrictions Precautions Precautions: Knee;Fall Precaution Booklet Issued: Yes (comment) Precaution Comments: HEP provided, reviewed no pillows under knee.  Restrictions Weight Bearing Restrictions: Yes LLE Weight Bearing: Weight bearing as tolerated    Mobility  Bed Mobility Overal bed mobility: Needs Assistance Bed Mobility: Supine to Sit     Supine to sit: Supervision     General bed mobility comments: supervision for safety; HOB flat and no use of bedrails; increased time needed  Transfers Overall transfer level: Needs assistance Equipment used: Rolling walker (2 wheeled) Transfers: Sit to/from Stand Sit to Stand: Supervision         General transfer comment: from EOB and commode; supervision for safety; cues for hand placement  Ambulation/Gait Ambulation/Gait assistance: Supervision Ambulation Distance (Feet): 100 Feet Assistive device: Rolling walker (2 wheeled) Gait Pattern/deviations: Step-through pattern;Decreased stride length;Decreased weight shift to left Gait velocity: decreased   General Gait Details: safe use of AD; cues for position of RW and sequencing of gait with use of AD; pt able to increase WB on L LE with increased distance   Stairs            Wheelchair Mobility    Modified Rankin (Stroke Patients Only)       Balance      Sitting balance-Leahy Scale: Good     Standing balance support: No upper extremity supported Standing balance-Leahy Scale: Fair Standing balance comment: static standing                    Cognition Arousal/Alertness: Awake/alert Behavior During Therapy: WFL for tasks assessed/performed Overall Cognitive Status: Within Functional Limits for tasks assessed                      Exercises Total Joint Exercises Quad Sets: Strengthening;Left;10 reps;Supine Heel Slides: Left;AROM;10 reps;Supine Hip ABduction/ADduction: AROM;Strengthening;Left;10 reps;Supine Straight Leg Raises: AROM;Strengthening;Left;10 reps;Supine Goniometric ROM: 0-90    General Comments General comments (skin integrity, edema, etc.): pt with c/o nausea during ambulation and with emesis upon return to room; pt reported nausea had subsided end of session      Pertinent Vitals/Pain Pain Assessment: 0-10 Pain Score: 5  Pain Location: L knee with therex Pain Descriptors / Indicators: Sore Pain Intervention(s): Limited activity within patient's tolerance;Monitored during session;Premedicated before session;Repositioned    Home Living                      Prior Function            PT Goals (current goals can now be found in the care plan section) Acute Rehab PT Goals Patient Stated Goal: go home and get back to being active PT Goal Formulation: With patient Time For Goal Achievement: 02/03/16 Potential to Achieve Goals: Good Progress towards PT goals: Progressing toward goals    Frequency  7X/week    PT Plan Current plan remains appropriate    Co-evaluation             End of Session Equipment Utilized During Treatment: Gait belt Activity Tolerance: Patient tolerated treatment well Patient left: in chair;with call bell/phone within reach     Time: 0925-0956 PT Time Calculation (min) (ACUTE ONLY): 31 min  Charges:  $Gait Training: 8-22 mins $Therapeutic Exercise:  8-22 mins                    G Codes:      Salina April, PTA Pager: [phone]   01/21/2016, 10:06 AM

## 2016-01-21 NOTE — Progress Notes (Signed)
Discharge instructions given. Pt verbalized understanding and all questions were answered.  

## 2016-01-22 NOTE — Op Note (Signed)
TOTAL KNEE REPLACEMENT OPERATIVE NOTE:  01/20/2016  7:35 AM  PATIENT:  Barbara Ferguson  70 y.o. female  PRE-OPERATIVE DIAGNOSIS:  primary osteoarthritis left knee  POST-OPERATIVE DIAGNOSIS:  primary osteoarthritis left knee  PROCEDURE:  Procedure(s): TOTAL KNEE ARTHROPLASTY  SURGEON:  Surgeon(s): Vickey Huger, MD  PHYSICIAN ASSISTANT: Carlyon Shadow, [facility]  ANESTHESIA:   spinal  DRAINS: Hemovac  SPECIMEN: None  COUNTS:  Correct  TOURNIQUET:   Total Tourniquet Time Documented: Thigh (Left) - 43 minutes Total: Thigh (Left) - 43 minutes   DICTATION:  Indication for procedure:    The patient is a 70 y.o. female who has failed conservative treatment for primary osteoarthritis left knee.  Informed consent was obtained prior to anesthesia. The risks versus benefits of the operation were explain and in a way the patient can, and did, understand.   On the implant demand matching protocol, this patient scored 10.  Therefore, this patient did" "did not receive a polyethylene insert with vitamin E which is a high demand implant.  Description of procedure:     The patient was taken to the operating room and placed under anesthesia.  The patient was positioned in the usual fashion taking care that all body parts were adequately padded and/or protected.  I foley catheter was not placed.  A tourniquet was applied and the leg prepped and draped in the usual sterile fashion.  The extremity was exsanguinated with the esmarch and tourniquet inflated to 350 mmHg.  Pre-operative range of motion was normal.  The knee was in 5 degree of mild varus.  A midline incision approximately 6-7 inches long was made with a #10 blade.  A new blade was used to make a parapatellar arthrotomy going 2-3 cm into the quadriceps tendon, over the patella, and alongside the medial aspect of the patellar tendon.  A synovectomy was then performed with the #10 blade and forceps. I then elevated the deep MCL off the medial  tibial metaphysis subperiosteally around to the semimembranosus attachment.    I everted the patella and used calipers to measure patellar thickness.  I used the reamer to ream down to appropriate thickness to recreate the native thickness.  I then removed excess bone with the rongeur and sagittal saw.  I used the appropriately sized template and drilled the three lug holes.  I then put the trial in place and measured the thickness with the calipers to ensure recreation of the native thickness.  The trial was then removed and the patella subluxed and the knee brought into flexion.  A homan retractor was place to retract and protect the patella and lateral structures.  A Z-retractor was place medially to protect the medial structures.  The extra-medullary alignment system was used to make cut the tibial articular surface perpendicular to the anamotic axis of the tibia and in 3 degrees of posterior slope.  The cut surface and alignment jig was removed.  I then used the intramedullary alignment guide to make a 6 valgus cut on the distal femur.  I then marked out the epicondylar axis on the distal femur.  The posterior condylar axis measured 3 degrees.  I then used the anterior referencing sizer and measured the femur to be a size 6.  The 4-In-1 cutting block was screwed into place in external rotation matching the posterior condylar angle, making our cuts perpendicular to the epicondylar axis.  Anterior, posterior and chamfer cuts were made with the sagittal saw.  The cutting block and cut pieces were  removed.  A lamina spreader was placed in 90 degrees of flexion.  The ACL, PCL, menisci, and posterior condylar osteophytes were removed.  A 10 mm spacer blocked was found to offer good flexion and extension gap balance after minimal in degree releasing.   The scoop retractor was then placed and the femoral finishing block was pinned in place.  The small sagittal saw was used as well as the lug drill to finish the  femur.  The block and cut surfaces were removed and the medullary canal hole filled with autograft bone from the cut pieces.  The tibia was delivered forward in deep flexion and external rotation.  A size C tray was selected and pinned into place centered on the medial 1/3 of the tibial tubercle.  The reamer and keel was used to prepare the tibia through the tray.    I then trialed with the size 6 femur, size C tibia, a 10 mm insert and the 30 patella.  I had excellent flexion/extension gap balance, excellent patella tracking.  Flexion was full and beyond 120 degrees; extension was zero.  These components were chosen and the staff opened them to me on the back table while the knee was lavaged copiously and the cement mixed.  The soft tissue was infiltrated with 60cc of exparel 1.3% through a 21 gauge needle.  I cemented in the components and removed all excess cement.  The polyethylene tibial component was snapped into place and the knee placed in extension while cement was hardening.  The capsule was infilltrated with 30cc of .25% Marcaine with epinephrine.  A hemovac was place in the joint exiting superolaterally.  A pain pump was place superomedially superficial to the arthrotomy.  Once the cement was hard, the tourniquet was let down.  Hemostasis was obtained.  The arthrotomy was closed with figure-8 #1 vicryl sutures.  The deep soft tissues were closed with #0 vicryls and the subcuticular layer closed with a running #2-0 vicryl.  The skin was reapproximated and closed with skin staples.  The wound was dressed with xeroform, 4 x4's, 2 ABD sponges, a single layer of webril and a TED stocking.   The patient was then awakened, extubated, and taken to the recovery room in stable condition.  BLOOD LOSS:  300cc DRAINS: 1 hemovac, 1 pain catheter COMPLICATIONS:  None.  PLAN OF CARE: Admit to inpatient   PATIENT DISPOSITION:  PACU - hemodynamically stable.   Delay start of Pharmacological VTE agent  (>24hrs) due to surgical blood loss or risk of bleeding:  not applicable  Please fax a copy of this op note to my office at [phone] (please only include page 1 and 2 of the Case Information op note)

## 2016-02-03 NOTE — Discharge Summary (Signed)
SPORTS MEDICINE & JOINT REPLACEMENT   Lara Mulch, MD   Carlynn Spry, PA-C Carlyon Shadow, PA-C [place], [place], [place]  [address]                             [phone]  PATIENT ID: Barbara Ferguson        MRN:  [id number]          DOB/AGE: 06-May-1946 / 70 y.o.    DISCHARGE SUMMARY  ADMISSION DATE:    01/20/2016 DISCHARGE DATE:   01/21/2016   ADMISSION DIAGNOSIS: primary osteoarthritis left knee    DISCHARGE DIAGNOSIS:  primary osteoarthritis left knee    ADDITIONAL DIAGNOSIS: Active Problems:   S/P total knee replacement  Past Medical History  Diagnosis Date  . Hypothyroidism   . Arthritis   . Anemia yrs ago, none recent  . 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 admitted on 01/20/2016 and found to have a diagnosis of primary osteoarthritis left knee.  After appropriate laboratory studies were obtained  they were taken to the operating room on 01/20/2016 and underwent Procedure(s): TOTAL KNEE ARTHROPLASTY.   They were given perioperative antibiotics:  Anti-infectives    Start     Dose/Rate Route Frequency Ordered Stop   01/20/16 1400  ceFAZolin (ANCEF) IVPB 1 g/50 mL premix     1 g 100 mL/hr over 30 Minutes Intravenous Every 6 hours 01/20/16 0939 01/20/16 1443   01/20/16 0700  ceFAZolin (ANCEF) IVPB 2g/100 mL premix     2 g 200 mL/hr over 30 Minutes Intravenous To ShortStay Surgical 01/17/16 1002 01/20/16 0740    .  Patient given tranexamic acid IV or topical and exparel intra-operatively.  Tolerated the procedure well.    POD# 1: Vital signs were stable.  Patient denied Chest pain, shortness of breath, or calf pain.  Patient was started on Lovenox 30 mg subcutaneously twice daily at 8am.  Consults to PT, OT, and care management were made.  The patient was weight  bearing as tolerated.  CPM was placed on the operative leg 0-90 degrees for 6-8 hours a day. When out of the CPM, patient was placed in the foam block to achieve full extension. Incentive spirometry was taught.  Dressing was changed.       POD #2, Continued  PT for ambulation and exercise program.  IV saline locked.  O2 discontinued.    The remainder of the hospital course was dedicated to ambulation and strengthening.   The patient was discharged on 1 day post op in  Good condition.  Blood products given:none  DIAGNOSTIC STUDIES: Recent vital signs: No data found.      Recent laboratory studies: No results for input(s): WBC, HGB, HCT, PLT in the last 168 hours. No results for input(s): NA, K, CL, CO2, BUN, CREATININE, GLUCOSE, CALCIUM in the last 168 hours. Lab Results  Component Value Date   INR 1.04 01/09/2016     Recent Radiographic Studies :  Dg Chest 2 View  01/09/2016  CLINICAL DATA:  Chronic cough. Preoperative evaluation. Renal insufficiency. EXAM: CHEST  2 VIEW COMPARISON:  None. FINDINGS: Lungs are clear. Heart size and pulmonary vascularity are normal. No adenopathy. There is degenerative  change in the thoracic spine. IMPRESSION: No edema or consolidation. Electronically Signed   By: Lowella Grip III M.D.   On: 01/09/2016 09:33    DISCHARGE INSTRUCTIONS: Discharge Instructions    CPM    Complete by:  As directed   Continuous passive motion machine (CPM):      Use the CPM from 0 to 90 for 4-6 hours per day.      You may increase by 10 per day.  You may break it up into 2 or 3 sessions per day.      Use CPM for 2 weeks or until you are told to stop.     Call MD / Call 911    Complete by:  As directed   If you experience chest pain or shortness of breath, CALL 911 and be transported to the hospital emergency room.  If you develope a fever above 101 F, pus (white drainage) or increased drainage or redness at the wound, or calf pain, call your surgeon's office.      Change dressing    Complete by:  As directed   Change dressing on tomorrow, then change the dressing daily with sterile 4 x 4 inch gauze dressing and apply TED hose.  You may clean the incision with alcohol prior to redressing.     Constipation Prevention    Complete by:  As directed   Drink plenty of fluids.  Prune juice may be helpful.  You may use a stool softener, such as Colace (over the counter) 100 mg twice a day.  Use MiraLax (over the counter) for constipation as needed.     Diet - low sodium heart healthy    Complete by:  As directed      Discharge instructions    Complete by:  As directed   INSTRUCTIONS AFTER JOINT REPLACEMENT   Remove items at home which could result in a fall. This includes throw rugs or furniture in walking pathways ICE to the affected joint every three hours while awake for 30 minutes at a time, for at least the first 3-5 days, and then as needed for pain and swelling.  Continue to use ice for pain and swelling. You may notice swelling that will progress down to the foot and ankle.  This is normal after surgery.  Elevate your leg when you are not up walking on it.   Continue to use the breathing machine you got in the hospital (incentive spirometer) which will help keep your temperature down.  It is common for your temperature to cycle up and down following surgery, especially at night when you are not up moving around and exerting yourself.  The breathing machine keeps your lungs expanded and your temperature down.   DIET:  As you were doing prior to hospitalization, we recommend a well-balanced diet.  DRESSING / WOUND CARE / SHOWERING  You may change your dressing 3-5 days after surgery.  Then change the dressing every day with sterile gauze.  Please use good hand washing techniques before changing the dressing.  Do not use any lotions or creams on the incision until instructed by your surgeon.  ACTIVITY  Increase activity slowly as tolerated, but follow the  weight bearing instructions below.   No driving for 6 weeks or until further direction given by your physician.  You cannot drive while taking narcotics.  No lifting or carrying greater than 10 lbs. until further directed by your surgeon. Avoid periods of inactivity such as sitting  longer than an hour when not asleep. This helps prevent blood clots.  You may return to work once you are authorized by your doctor.     WEIGHT BEARING   Weight bearing as tolerated with assist device (walker, cane, etc) as directed, use it as long as suggested by your surgeon or therapist, typically at least 4-6 weeks.   EXERCISES  Results after joint replacement surgery are often greatly improved when you follow the exercise, range of motion and muscle strengthening exercises prescribed by your doctor. Safety measures are also important to protect the joint from further injury. Any time any of these exercises cause you to have increased pain or swelling, decrease what you are doing until you are comfortable again and then slowly increase them. If you have problems or questions, call your caregiver or physical therapist for advice.   Rehabilitation is important following a joint replacement. After just a few days of immobilization, the muscles of the leg can become weakened and shrink (atrophy).  These exercises are designed to build up the tone and strength of the thigh and leg muscles and to improve motion. Often times heat used for twenty to thirty minutes before working out will loosen up your tissues and help with improving the range of motion but do not use heat for the first two weeks following surgery (sometimes heat can increase post-operative swelling).   These exercises can be done on a training (exercise) mat, on the floor, on a table or on a bed. Use whatever works the best and is most comfortable for you.    Use music or television while you are exercising so that the exercises are a pleasant break in  your day. This will make your life better with the exercises acting as a break in your routine that you can look forward to.   Perform all exercises about fifteen times, three times per day or as directed.  You should exercise both the operative leg and the other leg as well.   Exercises include:   Quad Sets - Tighten up the muscle on the front of the thigh (Quad) and hold for 5-10 seconds.   Straight Leg Raises - With your knee straight (if you were given a brace, keep it on), lift the leg to 60 degrees, hold for 3 seconds, and slowly lower the leg.  Perform this exercise against resistance later as your leg gets stronger.  Leg Slides: Lying on your back, slowly slide your foot toward your buttocks, bending your knee up off the floor (only go as far as is comfortable). Then slowly slide your foot back down until your leg is flat on the floor again.  Angel Wings: Lying on your back spread your legs to the side as far apart as you can without causing discomfort.  Hamstring Strength:  Lying on your back, push your heel against the floor with your leg straight by tightening up the muscles of your buttocks.  Repeat, but this time bend your knee to a comfortable angle, and push your heel against the floor.  You may put a pillow under the heel to make it more comfortable if necessary.   A rehabilitation program following joint replacement surgery can speed recovery and prevent re-injury in the future due to weakened muscles. Contact your doctor or a physical therapist for more information on knee rehabilitation.    CONSTIPATION  Constipation is defined medically as fewer than three stools per week and severe constipation as less than one  stool per week.  Even if you have a regular bowel pattern at home, your normal regimen is likely to be disrupted due to multiple reasons following surgery.  Combination of anesthesia, postoperative narcotics, change in appetite and fluid intake all can affect your bowels.    YOU MUST use at least one of the following options; they are listed in order of increasing strength to get the job done.  They are all available over the counter, and you may need to use some, POSSIBLY even all of these options:    Drink plenty of fluids (prune juice may be helpful) and high fiber foods Colace 100 mg by mouth twice a day  Senokot for constipation as directed and as needed Dulcolax (bisacodyl), take with full glass of water  Miralax (polyethylene glycol) once or twice a day as needed.  If you have tried all these things and are unable to have a bowel movement in the first 3-4 days after surgery call either your surgeon or your primary doctor.    If you experience loose stools or diarrhea, hold the medications until you stool forms back up.  If your symptoms do not get better within 1 week or if they get worse, check with your doctor.  If you experience "the worst abdominal pain ever" or develop nausea or vomiting, please contact the office immediately for further recommendations for treatment.   ITCHING:  If you experience itching with your medications, try taking only a single pain pill, or even half a pain pill at a time.  You can also use Benadryl over the counter for itching or also to help with sleep.   TED HOSE STOCKINGS:  Use stockings on both legs until for at least 2 weeks or as directed by physician office. They may be removed at night for sleeping.  MEDICATIONS:  See your medication summary on the "After Visit Summary" that nursing will review with you.  You may have some home medications which will be placed on hold until you complete the course of blood thinner medication.  It is important for you to complete the blood thinner medication as prescribed.  PRECAUTIONS:  If you experience chest pain or shortness of breath - call 911 immediately for transfer to the hospital emergency department.   If you develop a fever greater that 101 F, purulent drainage from wound,  increased redness or drainage from wound, foul odor from the wound/dressing, or calf pain - CONTACT YOUR SURGEON.                                                   FOLLOW-UP APPOINTMENTS:  If you do not already have a post-op appointment, please call the office for an appointment to be seen by your surgeon.  Guidelines for how soon to be seen are listed in your "After Visit Summary", but are typically between 1-4 weeks after surgery.  OTHER INSTRUCTIONS:   Knee Replacement:  Do not place pillow under knee, focus on keeping the knee straight while resting. CPM instructions: 0-90 degrees, 2 hours in the morning, 2 hours in the afternoon, and 2 hours in the evening. Place foam block, curve side up under heel at all times except when in CPM or when walking.  DO NOT modify, tear, cut, or change the foam block in any way.  MAKE SURE YOU:  Understand these instructions.  Get help right away if you are not doing well or get worse.    Thank you for letting us be a part of your medical care team.  It is a privilege we respect greatly.  We hope these instructions will help you stay on track for a fast and full recovery!     Driving restrictions    Complete by:  As directed   No driving for 2 weeks     Increase activity slowly as tolerated    Complete by:  As directed            DISCHARGE MEDICATIONS:     Medication List    TAKE these medications        enoxaparin 40 MG/0.4ML injection  Commonly known as:  LOVENOX  Inject 0.4 mLs (40 mg total) into the skin daily.     GLUCOSAMINE CHONDROITIN JOINT PO  Take 1-2 tablets by mouth See admin instructions. Pt takes 2 tablet in the morning, 1 tablet in the evening     levothyroxine 75 MCG tablet  Commonly known as:  SYNTHROID, LEVOTHROID  Take 75 mcg by mouth daily before breakfast.     methocarbamol 500 MG tablet  Commonly known as:  ROBAXIN  Take 1-2 tablets (500-1,000 mg total) by mouth every 6 (six) hours as needed for muscle spasms.      MULTIVITAMIN & MINERAL PO  Take 1 tablet by mouth daily.     ondansetron 4 MG tablet  Commonly known as:  ZOFRAN  Take 1 tablet (4 mg total) by mouth every 6 (six) hours as needed for nausea.     oxyCODONE 5 MG immediate release tablet  Commonly known as:  Oxy IR/ROXICODONE  Take 1-2 tablets (5-10 mg total) by mouth every 3 (three) hours as needed for breakthrough pain.     oxyCODONE 10 mg 12 hr tablet  Commonly known as:  OXYCONTIN  Take 1 tablet (10 mg total) by mouth every 12 (twelve) hours.        FOLLOW UP VISIT:       Follow-up Information    Follow up with Rudean Haskell, MD. Call on 02/04/2016.   Specialty:  Orthopedic Surgery   Contact information:   [place] [address] [phone]       DISPOSITION: HOME VS. SNF  CONDITION:  Good   [name] 02/03/2016, 6:45 AM

## 2016-03-06 ENCOUNTER — Other Ambulatory Visit: Payer: Self-pay | Admitting: Orthopedic Surgery

## 2016-05-20 NOTE — Pre-Procedure Instructions (Addendum)
Barbara Ferguson  05/20/2016      [facility] [address] [place] [address] Phone: [phone] Fax: [phone]    Your procedure is scheduled on Mon, Aug 28 @ 10:30 AM  Report to [facility] Admitting at 8:30 AM  Call this number if you have problems the morning of surgery:  [phone]   Remember:  Do not eat food or drink liquids after midnight.  Take these medicines the morning of surgery with A SIP OF WATER Synthroid(Levothyroxine)              No Goody's,BC's,Aleve,Advil,Motrin,Ibuprofen,Aspirin,Fish Oil,or any Herbal Medications a week prior to surgery.    Do not wear jewelry, make-up or nail polish.  Do not wear lotions, powders, or perfumes.    Do not shave 48 hours prior to surgery.    Do not bring valuables to the hospital.  [facility] is not responsible for any belongings or valuables.  Contacts, dentures or bridgework may not be worn into surgery.  Leave your suitcase in the car.  After surgery it may be brought to your room.  For patients admitted to the hospital, discharge time will be determined by your treatment team.  Patients discharged the day of surgery will not be allowed to drive home.    [facility]  Before surgery, you can play an important role.  Because skin is not sterile, your skin needs to be as free of germs as possible.  You can reduce the number of germs on you skin by washing with CHG (chlorahexidine gluconate) soap before surgery.  CHG is an antiseptic cleaner which kills germs and bonds with the skin to continue killing germs even after washing.  Please DO NOT use if you have an allergy to CHG or antibacterial soaps.  If your skin becomes reddened/irritated stop using the CHG and inform your nurse when you arrive at Short Stay.  Do not shave (including legs and underarms) for at least 48  hours prior to the first CHG shower.  You may shave your face.  Please follow these instructions carefully:   1.  Shower with CHG Soap the night before surgery and the                                morning of Surgery.  2.  If you choose to wash your hair, wash your hair first as usual with your       normal shampoo.  3.  After you shampoo, rinse your hair and body thoroughly to remove the                      Shampoo.  4.  Use CHG as you would any other liquid soap.  You can apply chg directly       to the skin and wash gently with scrungie or a clean washcloth.  5.  Apply the CHG Soap to your body ONLY FROM THE NECK DOWN.        Do not use on open wounds or open sores.  Avoid contact with your eyes,       ears, mouth and genitals (private parts).  Wash genitals (private parts)       with your normal soap.  6.  Wash thoroughly,  paying special attention to the area where your surgery        will be performed.  7.  Thoroughly rinse your body with warm water from the neck down.  8.  DO NOT shower/wash with your normal soap after using and rinsing off       the CHG Soap.  9.  Pat yourself dry with a clean towel.            10.  Wear clean pajamas.            11.  Place clean sheets on your bed the night of your first shower and do not        sleep with pets.  Day of Surgery  Do not apply any lotions/deoderants the morning of surgery.  Please wear clean clothes to the hospital/surgery center.    Please read over the following fact sheets that you were given. Pain Booklet, Coughing and Deep Breathing, MRSA Information and Surgical Site Infection Prevention

## 2016-05-21 ENCOUNTER — Encounter (HOSPITAL_COMMUNITY)
Admission: RE | Admit: 2016-05-21 | Discharge: 2016-05-21 | Disposition: A | Discharge status: Home / Self Care | Payer: Medicare Other | Source: Ambulatory Visit | Attending: Orthopedic Surgery | Admitting: Orthopedic Surgery

## 2016-05-21 ENCOUNTER — Encounter (HOSPITAL_COMMUNITY): Payer: Self-pay

## 2016-05-21 DIAGNOSIS — M1711 Unilateral primary osteoarthritis, right knee: Secondary | ICD-10-CM | POA: Diagnosis not present

## 2016-05-21 DIAGNOSIS — Z01812 Encounter for preprocedural laboratory examination: Secondary | ICD-10-CM | POA: Insufficient documentation

## 2016-05-21 HISTORY — DX: Effusion, unspecified joint: M25.40

## 2016-05-21 HISTORY — DX: Personal history of urinary calculi: Z87.442

## 2016-05-21 HISTORY — DX: Personal history of colon polyps, unspecified: Z86.0100

## 2016-05-21 HISTORY — DX: Other specified postprocedural states: Z98.890

## 2016-05-21 HISTORY — DX: Other specified postprocedural states: R11.2

## 2016-05-21 HISTORY — DX: Pain in unspecified joint: M25.50

## 2016-05-21 HISTORY — DX: Personal history of colonic polyps: Z86.010

## 2016-05-21 LAB — URINE MICROSCOPIC-ADD ON: RBC / HPF: NONE SEEN RBC/hpf (ref 0–5)

## 2016-05-21 LAB — COMPREHENSIVE METABOLIC PANEL
ALBUMIN: 4.1 g/dL (ref 3.5–5.0)
ALT: 15 U/L (ref 14–54)
AST: 22 U/L (ref 15–41)
Alkaline Phosphatase: 75 U/L (ref 38–126)
Anion gap: 9 (ref 5–15)
BILIRUBIN TOTAL: 0.6 mg/dL (ref 0.3–1.2)
BUN: 17 mg/dL (ref 6–20)
CHLORIDE: 108 mmol/L (ref 101–111)
CO2: 20 mmol/L — ABNORMAL LOW (ref 22–32)
Calcium: 9.6 mg/dL (ref 8.9–10.3)
Creatinine, Ser: 1.19 mg/dL — ABNORMAL HIGH (ref 0.44–1.00)
GFR calc Af Amer: 52 mL/min — ABNORMAL LOW (ref >60)
GFR calc non Af Amer: 45 mL/min — ABNORMAL LOW (ref >60)
GLUCOSE: 115 mg/dL — AB (ref 65–99)
Potassium: 3.9 mmol/L (ref 3.5–5.1)
Sodium: 137 mmol/L (ref 135–145)
TOTAL PROTEIN: 6.4 g/dL — AB (ref 6.5–8.1)

## 2016-05-21 LAB — URINALYSIS, ROUTINE W REFLEX MICROSCOPIC
Bilirubin Urine: NEGATIVE
Glucose, UA: NEGATIVE mg/dL
Hgb urine dipstick: NEGATIVE
Ketones, ur: NEGATIVE mg/dL
NITRITE: NEGATIVE
PH: 5 (ref 5.0–8.0)
Protein, ur: NEGATIVE mg/dL
SPECIFIC GRAVITY, URINE: 1.017 (ref 1.005–1.030)

## 2016-05-21 LAB — CBC WITH DIFFERENTIAL/PLATELET
BASOS ABS: 0.1 10*3/uL (ref 0.0–0.1)
BASOS PCT: 1 %
Eosinophils Absolute: 0.1 10*3/uL (ref 0.0–0.7)
Eosinophils Relative: 1 %
HEMATOCRIT: 40.6 % (ref 36.0–46.0)
Hemoglobin: 13.3 g/dL (ref 12.0–15.0)
Lymphocytes Relative: 18 %
Lymphs Abs: 1.4 10*3/uL (ref 0.7–4.0)
MCH: 29.7 pg (ref 26.0–34.0)
MCHC: 32.8 g/dL (ref 30.0–36.0)
MCV: 90.6 fL (ref 78.0–100.0)
MONO ABS: 0.5 10*3/uL (ref 0.1–1.0)
Monocytes Relative: 6 %
NEUTROS ABS: 5.8 10*3/uL (ref 1.7–7.7)
NEUTROS PCT: 74 %
Platelets: 269 10*3/uL (ref 150–400)
RBC: 4.48 MIL/uL (ref 3.87–5.11)
RDW: 13.9 % (ref 11.5–15.5)
WBC: 7.9 10*3/uL (ref 4.0–10.5)

## 2016-05-21 LAB — PROTIME-INR
INR: 1.07
Prothrombin Time: 13.9 seconds (ref 11.4–15.2)

## 2016-05-21 LAB — SURGICAL PCR SCREEN
MRSA, PCR: NEGATIVE
STAPHYLOCOCCUS AUREUS: NEGATIVE

## 2016-05-21 LAB — APTT: APTT: 30 s (ref 24–36)

## 2016-05-21 MED ORDER — CHLORHEXIDINE GLUCONATE 4 % EX LIQD: 60.0000 mL | Freq: Once | CUTANEOUS | Status: DC

## 2016-05-21 NOTE — Progress Notes (Addendum)
Cardiologist denies  Medical Md is with [facility] denies  Stress test denies  Heart cath denies  CXR in epic from 01-09-16  EKG under media tab from 12-10-15

## 2016-05-22 LAB — URINE CULTURE: CULTURE: NO GROWTH

## 2016-05-29 MED ORDER — SODIUM CHLORIDE 0.9 % IV SOLN: INTRAVENOUS | Status: DC

## 2016-05-29 MED ORDER — TRANEXAMIC ACID 1000 MG/10ML IV SOLN
1000.0000 mg | INTRAVENOUS | Status: AC
  Administered 2016-06-01: 1000 mg via INTRAVENOUS
  Filled 2016-05-29: qty 10

## 2016-05-29 MED ORDER — BUPIVACAINE LIPOSOME 1.3 % IJ SUSP
20.0000 mL | INTRAMUSCULAR | Status: AC
  Administered 2016-06-01: 20 mL
  Filled 2016-05-29: qty 20

## 2016-05-29 MED ORDER — CEFAZOLIN SODIUM-DEXTROSE 2-4 GM/100ML-% IV SOLN
2.0000 g | INTRAVENOUS | Status: AC
  Administered 2016-06-01: 2 g via INTRAVENOUS
  Filled 2016-05-29 (×2): qty 100

## 2016-05-29 MED ORDER — TRANEXAMIC ACID 1000 MG/10ML IV SOLN
1000.0000 mg | INTRAVENOUS | Status: DC
  Filled 2016-05-29: qty 10

## 2016-05-29 MED ORDER — ACETAMINOPHEN 500 MG PO TABS
1000.0000 mg | ORAL_TABLET | Freq: Once | ORAL | Status: AC
  Administered 2016-06-01: 1000 mg via ORAL
  Filled 2016-05-29: qty 2

## 2016-06-01 ENCOUNTER — Inpatient Hospital Stay (HOSPITAL_COMMUNITY)
Admission: RE | Admit: 2016-06-01 | Discharge: 2016-06-02 | DRG: 470 | Disposition: A | Discharge status: Home Health | Payer: Medicare Other | Source: Ambulatory Visit | Attending: Orthopedic Surgery | Admitting: Orthopedic Surgery

## 2016-06-01 ENCOUNTER — Encounter (HOSPITAL_COMMUNITY)
Admission: RE | Disposition: A | Discharge status: Home Health | Payer: Self-pay | Source: Ambulatory Visit | Attending: Orthopedic Surgery

## 2016-06-01 ENCOUNTER — Encounter (HOSPITAL_COMMUNITY): Payer: Self-pay | Admitting: Anesthesiology

## 2016-06-01 ENCOUNTER — Inpatient Hospital Stay (HOSPITAL_COMMUNITY): Payer: Medicare Other | Admitting: Anesthesiology

## 2016-06-01 DIAGNOSIS — E039 Hypothyroidism, unspecified: Secondary | ICD-10-CM | POA: Diagnosis present

## 2016-06-01 DIAGNOSIS — M1711 Unilateral primary osteoarthritis, right knee: Secondary | ICD-10-CM | POA: Diagnosis present

## 2016-06-01 DIAGNOSIS — D62 Acute posthemorrhagic anemia: Secondary | ICD-10-CM | POA: Diagnosis not present

## 2016-06-01 DIAGNOSIS — Z96659 Presence of unspecified artificial knee joint: Secondary | ICD-10-CM

## 2016-06-01 HISTORY — PX: TOTAL KNEE ARTHROPLASTY: SHX125

## 2016-06-01 SURGERY — ARTHROPLASTY, KNEE, TOTAL
Anesthesia: Spinal | Laterality: Right

## 2016-06-01 MED ORDER — ACETAMINOPHEN 500 MG PO TABS
1000.0000 mg | ORAL_TABLET | Freq: Four times a day (QID) | ORAL | Status: DC
  Administered 2016-06-02 (×3): 1000 mg via ORAL
  Filled 2016-06-01 (×3): qty 2

## 2016-06-01 MED ORDER — OXYCODONE HCL ER 10 MG PO T12A
10.0000 mg | EXTENDED_RELEASE_TABLET | Freq: Two times a day (BID) | ORAL | Status: DC
  Administered 2016-06-02: 10 mg via ORAL
  Filled 2016-06-01: qty 1

## 2016-06-01 MED ORDER — FLEET ENEMA 7-19 GM/118ML RE ENEM: 1.0000 | ENEMA | Freq: Once | RECTAL | Status: DC | PRN

## 2016-06-01 MED ORDER — MIDAZOLAM HCL 2 MG/2ML IJ SOLN
1.0000 mg | Freq: Once | INTRAMUSCULAR | Status: AC
  Administered 2016-06-01: 1 mg via INTRAVENOUS

## 2016-06-01 MED ORDER — ONDANSETRON HCL 4 MG/2ML IJ SOLN
4.0000 mg | Freq: Four times a day (QID) | INTRAMUSCULAR | Status: DC | PRN
  Administered 2016-06-02: 4 mg via INTRAVENOUS
  Filled 2016-06-01: qty 2

## 2016-06-01 MED ORDER — PROPOFOL 500 MG/50ML IV EMUL
INTRAVENOUS | Status: DC | PRN
  Administered 2016-06-01: 25 ug/kg/min via INTRAVENOUS

## 2016-06-01 MED ORDER — FENTANYL CITRATE (PF) 100 MCG/2ML IJ SOLN
INTRAMUSCULAR | Status: AC
  Filled 2016-06-01: qty 2

## 2016-06-01 MED ORDER — SENNOSIDES-DOCUSATE SODIUM 8.6-50 MG PO TABS: 1.0000 | ORAL_TABLET | Freq: Every evening | ORAL | Status: DC | PRN

## 2016-06-01 MED ORDER — HYDROMORPHONE HCL 1 MG/ML IJ SOLN
1.0000 mg | INTRAMUSCULAR | Status: DC | PRN
  Administered 2016-06-01: 1 mg via INTRAVENOUS
  Filled 2016-06-01: qty 1

## 2016-06-01 MED ORDER — METHOCARBAMOL 1000 MG/10ML IJ SOLN
500.0000 mg | Freq: Four times a day (QID) | INTRAMUSCULAR | Status: DC | PRN
  Filled 2016-06-01: qty 5

## 2016-06-01 MED ORDER — ONDANSETRON HCL 4 MG PO TABS: 4.0000 mg | ORAL_TABLET | Freq: Four times a day (QID) | ORAL | Status: DC | PRN

## 2016-06-01 MED ORDER — PHENOL 1.4 % MT LIQD: 1.0000 | OROMUCOSAL | Status: DC | PRN

## 2016-06-01 MED ORDER — EPHEDRINE 5 MG/ML INJ
INTRAVENOUS | Status: AC
  Filled 2016-06-01: qty 10

## 2016-06-01 MED ORDER — MIDAZOLAM HCL 2 MG/2ML IJ SOLN: 0.5000 mg | Freq: Once | INTRAMUSCULAR | Status: DC | PRN

## 2016-06-01 MED ORDER — CEFAZOLIN IN D5W 1 GM/50ML IV SOLN
1.0000 g | Freq: Four times a day (QID) | INTRAVENOUS | Status: AC
  Administered 2016-06-01 – 2016-06-02 (×2): 1 g via INTRAVENOUS
  Filled 2016-06-01 (×2): qty 50

## 2016-06-01 MED ORDER — METOCLOPRAMIDE HCL 5 MG/ML IJ SOLN
5.0000 mg | Freq: Three times a day (TID) | INTRAMUSCULAR | Status: DC | PRN
  Administered 2016-06-02: 10 mg via INTRAVENOUS
  Filled 2016-06-01: qty 2

## 2016-06-01 MED ORDER — ZOLPIDEM TARTRATE 5 MG PO TABS: 5.0000 mg | ORAL_TABLET | Freq: Every evening | ORAL | Status: DC | PRN

## 2016-06-01 MED ORDER — FENTANYL CITRATE (PF) 100 MCG/2ML IJ SOLN
50.0000 ug | Freq: Once | INTRAMUSCULAR | Status: AC
  Administered 2016-06-01: 50 ug via INTRAVENOUS

## 2016-06-01 MED ORDER — PHENYLEPHRINE HCL 10 MG/ML IJ SOLN
INTRAMUSCULAR | Status: DC | PRN
  Administered 2016-06-01: 40 ug via INTRAVENOUS

## 2016-06-01 MED ORDER — DEXAMETHASONE SODIUM PHOSPHATE 10 MG/ML IJ SOLN
10.0000 mg | Freq: Once | INTRAMUSCULAR | Status: AC
  Administered 2016-06-02: 10 mg via INTRAVENOUS
  Filled 2016-06-01: qty 1

## 2016-06-01 MED ORDER — ASPIRIN EC 325 MG PO TBEC
325.0000 mg | DELAYED_RELEASE_TABLET | Freq: Two times a day (BID) | ORAL | Status: DC
  Administered 2016-06-01 – 2016-06-02 (×2): 325 mg via ORAL
  Filled 2016-06-01 (×2): qty 1

## 2016-06-01 MED ORDER — MEPERIDINE HCL 25 MG/ML IJ SOLN: 6.2500 mg | INTRAMUSCULAR | Status: DC | PRN

## 2016-06-01 MED ORDER — DOCUSATE SODIUM 100 MG PO CAPS
100.0000 mg | ORAL_CAPSULE | Freq: Two times a day (BID) | ORAL | Status: DC
Start: 2016-06-01 — End: 2016-06-02
  Administered 2016-06-01 – 2016-06-02 (×2): 100 mg via ORAL
  Filled 2016-06-01 (×2): qty 1

## 2016-06-01 MED ORDER — SODIUM CHLORIDE 0.9 % IV SOLN
INTRAVENOUS | Status: DC
  Administered 2016-06-02: via INTRAVENOUS

## 2016-06-01 MED ORDER — PROPOFOL 10 MG/ML IV BOLUS
INTRAVENOUS | Status: AC
  Filled 2016-06-01: qty 20

## 2016-06-01 MED ORDER — BUPIVACAINE IN DEXTROSE 0.75-8.25 % IT SOLN
INTRATHECAL | Status: DC | PRN
  Administered 2016-06-01: 12 mg via INTRATHECAL

## 2016-06-01 MED ORDER — BUPIVACAINE-EPINEPHRINE 0.5% -1:200000 IJ SOLN
INTRAMUSCULAR | Status: DC | PRN
  Administered 2016-06-01: 30 mL

## 2016-06-01 MED ORDER — BUPIVACAINE-EPINEPHRINE (PF) 0.5% -1:200000 IJ SOLN: INTRAMUSCULAR | Status: DC | PRN

## 2016-06-01 MED ORDER — BISACODYL 5 MG PO TBEC: 5.0000 mg | DELAYED_RELEASE_TABLET | Freq: Every day | ORAL | Status: DC | PRN

## 2016-06-01 MED ORDER — MIDAZOLAM HCL 2 MG/2ML IJ SOLN
INTRAMUSCULAR | Status: AC
  Filled 2016-06-01: qty 2

## 2016-06-01 MED ORDER — ALUM & MAG HYDROXIDE-SIMETH 200-200-20 MG/5ML PO SUSP: 30.0000 mL | ORAL | Status: DC | PRN

## 2016-06-01 MED ORDER — PROMETHAZINE HCL 25 MG/ML IJ SOLN: 6.2500 mg | INTRAMUSCULAR | Status: DC | PRN

## 2016-06-01 MED ORDER — DIPHENHYDRAMINE HCL 12.5 MG/5ML PO ELIX: 12.5000 mg | ORAL_SOLUTION | ORAL | Status: DC | PRN

## 2016-06-01 MED ORDER — ACETAMINOPHEN 650 MG RE SUPP: 650.0000 mg | Freq: Four times a day (QID) | RECTAL | Status: DC | PRN

## 2016-06-01 MED ORDER — FENTANYL CITRATE (PF) 100 MCG/2ML IJ SOLN
INTRAMUSCULAR | Status: DC | PRN
  Administered 2016-06-01 (×2): 50 ug via INTRAVENOUS

## 2016-06-01 MED ORDER — OXYCODONE HCL 5 MG PO TABS
5.0000 mg | ORAL_TABLET | ORAL | Status: DC | PRN
  Administered 2016-06-01 – 2016-06-02 (×6): 10 mg via ORAL
  Filled 2016-06-01 (×6): qty 2

## 2016-06-01 MED ORDER — EPHEDRINE SULFATE 50 MG/ML IJ SOLN
INTRAMUSCULAR | Status: DC | PRN
  Administered 2016-06-01 (×2): 10 mg via INTRAVENOUS

## 2016-06-01 MED ORDER — BUPIVACAINE-EPINEPHRINE (PF) 0.5% -1:200000 IJ SOLN
INTRAMUSCULAR | Status: DC | PRN
  Administered 2016-06-01: 30 mL via PERINEURAL

## 2016-06-01 MED ORDER — BUPIVACAINE-EPINEPHRINE (PF) 0.25% -1:200000 IJ SOLN
INTRAMUSCULAR | Status: AC
  Filled 2016-06-01: qty 30

## 2016-06-01 MED ORDER — TRANEXAMIC ACID 1000 MG/10ML IV SOLN
1000.0000 mg | Freq: Once | INTRAVENOUS | Status: AC
  Administered 2016-06-01: 1000 mg via INTRAVENOUS
  Filled 2016-06-01: qty 10

## 2016-06-01 MED ORDER — ACETAMINOPHEN 325 MG PO TABS: 650.0000 mg | ORAL_TABLET | Freq: Four times a day (QID) | ORAL | Status: DC | PRN

## 2016-06-01 MED ORDER — ONDANSETRON HCL 4 MG/2ML IJ SOLN
INTRAMUSCULAR | Status: AC
  Filled 2016-06-01: qty 2

## 2016-06-01 MED ORDER — ONDANSETRON HCL 4 MG/2ML IJ SOLN
INTRAMUSCULAR | Status: DC | PRN
  Administered 2016-06-01: 4 mg via INTRAVENOUS

## 2016-06-01 MED ORDER — MIDAZOLAM HCL 5 MG/5ML IJ SOLN
INTRAMUSCULAR | Status: DC | PRN
  Administered 2016-06-01 (×2): 1 mg via INTRAVENOUS

## 2016-06-01 MED ORDER — MENTHOL 3 MG MT LOZG: 1.0000 | LOZENGE | OROMUCOSAL | Status: DC | PRN

## 2016-06-01 MED ORDER — PHENYLEPHRINE 40 MCG/ML (10ML) SYRINGE FOR IV PUSH (FOR BLOOD PRESSURE SUPPORT)
PREFILLED_SYRINGE | INTRAVENOUS | Status: AC
  Filled 2016-06-01: qty 10

## 2016-06-01 MED ORDER — 0.9 % SODIUM CHLORIDE (POUR BTL) OPTIME
TOPICAL | Status: DC | PRN
  Administered 2016-06-01: 1000 mL

## 2016-06-01 MED ORDER — LACTATED RINGERS IV SOLN
INTRAVENOUS | Status: DC
  Administered 2016-06-01 (×2): via INTRAVENOUS

## 2016-06-01 MED ORDER — BUPIVACAINE-EPINEPHRINE (PF) 0.5% -1:200000 IJ SOLN
INTRAMUSCULAR | Status: AC
  Filled 2016-06-01: qty 30

## 2016-06-01 MED ORDER — METHOCARBAMOL 500 MG PO TABS
500.0000 mg | ORAL_TABLET | Freq: Four times a day (QID) | ORAL | Status: DC | PRN
  Administered 2016-06-02 (×2): 500 mg via ORAL
  Filled 2016-06-01 (×2): qty 1

## 2016-06-01 MED ORDER — SODIUM CHLORIDE 0.9 % IR SOLN
Status: DC | PRN
  Administered 2016-06-01: 3000 mL

## 2016-06-01 MED ORDER — HYDROMORPHONE HCL 1 MG/ML IJ SOLN: 0.2500 mg | INTRAMUSCULAR | Status: DC | PRN

## 2016-06-01 MED ORDER — LEVOTHYROXINE SODIUM 75 MCG PO TABS
75.0000 ug | ORAL_TABLET | Freq: Every day | ORAL | Status: DC
  Administered 2016-06-02: 75 ug via ORAL
  Filled 2016-06-01: qty 1

## 2016-06-01 MED ORDER — SODIUM CHLORIDE 0.9 % IJ SOLN
INTRAMUSCULAR | Status: DC | PRN
Start: 2016-06-01 — End: 2016-06-01
  Administered 2016-06-01: 20 mL

## 2016-06-01 MED ORDER — OXYCODONE HCL ER 10 MG PO T12A
10.0000 mg | EXTENDED_RELEASE_TABLET | Freq: Two times a day (BID) | ORAL | Status: DC
  Filled 2016-06-01: qty 1

## 2016-06-01 MED ORDER — METOCLOPRAMIDE HCL 5 MG PO TABS: 5.0000 mg | ORAL_TABLET | Freq: Three times a day (TID) | ORAL | Status: DC | PRN

## 2016-06-01 SURGICAL SUPPLY — 63 items
BANDAGE ACE 6X5 VEL STRL LF (GAUZE/BANDAGES/DRESSINGS) ×3 IMPLANT
BANDAGE ESMARK 6X9 LF (GAUZE/BANDAGES/DRESSINGS) ×1 IMPLANT
BLADE SAGITTAL 13X1.27X60 (BLADE) ×2 IMPLANT
BLADE SAGITTAL 13X1.27X60MM (BLADE) ×1
BLADE SAW SGTL 83.5X18.5 (BLADE) ×3 IMPLANT
BLADE SURG 10 STRL SS (BLADE) ×3 IMPLANT
BNDG ESMARK 6X9 LF (GAUZE/BANDAGES/DRESSINGS) ×3
BOWL SMART MIX CTS (DISPOSABLE) ×3 IMPLANT
CAPT KNEE TOTAL 3 ×3 IMPLANT
CEMENT BONE SIMPLEX SPEEDSET (Cement) ×6 IMPLANT
CLOSURE STERI-STRIP 1/2X4 (GAUZE/BANDAGES/DRESSINGS) ×1
CLOSURE WOUND 1/2 X4 (GAUZE/BANDAGES/DRESSINGS) ×1
CLSR STERI-STRIP ANTIMIC 1/2X4 (GAUZE/BANDAGES/DRESSINGS) ×2 IMPLANT
COVER SURGICAL LIGHT HANDLE (MISCELLANEOUS) ×3 IMPLANT
CUFF TOURNIQUET SINGLE 34IN LL (TOURNIQUET CUFF) ×3 IMPLANT
DRAPE EXTREMITY T 121X128X90 (DRAPE) ×3 IMPLANT
DRAPE INCISE IOBAN 66X45 STRL (DRAPES) ×6 IMPLANT
DRAPE PROXIMA HALF (DRAPES) IMPLANT
DRAPE U-SHAPE 47X51 STRL (DRAPES) ×3 IMPLANT
DRSG AQUACEL AG ADV 3.5X 6 (GAUZE/BANDAGES/DRESSINGS) ×3 IMPLANT
DRSG AQUACEL AG ADV 3.5X10 (GAUZE/BANDAGES/DRESSINGS) ×3 IMPLANT
DRSG PAD ABDOMINAL 8X10 ST (GAUZE/BANDAGES/DRESSINGS) ×3 IMPLANT
DURAPREP 26ML APPLICATOR (WOUND CARE) ×3 IMPLANT
ELECT REM PT RETURN 9FT ADLT (ELECTROSURGICAL) ×3
ELECTRODE REM PT RTRN 9FT ADLT (ELECTROSURGICAL) ×1 IMPLANT
GLOVE BIOGEL M 7.0 STRL (GLOVE) IMPLANT
GLOVE BIOGEL PI IND STRL 7.5 (GLOVE) IMPLANT
GLOVE BIOGEL PI IND STRL 8.5 (GLOVE) ×5 IMPLANT
GLOVE BIOGEL PI INDICATOR 7.5 (GLOVE)
GLOVE BIOGEL PI INDICATOR 8.5 (GLOVE) ×10
GLOVE SURG ORTHO 8.0 STRL STRW (GLOVE) ×18 IMPLANT
GOWN STRL REUS W/ TWL LRG LVL3 (GOWN DISPOSABLE) ×1 IMPLANT
GOWN STRL REUS W/ TWL XL LVL3 (GOWN DISPOSABLE) ×2 IMPLANT
GOWN STRL REUS W/TWL 2XL LVL3 (GOWN DISPOSABLE) ×3 IMPLANT
GOWN STRL REUS W/TWL LRG LVL3 (GOWN DISPOSABLE) ×2
GOWN STRL REUS W/TWL XL LVL3 (GOWN DISPOSABLE) ×4
HANDPIECE INTERPULSE COAX TIP (DISPOSABLE) ×2
HOOD PEEL AWAY FACE SHEILD DIS (HOOD) ×6 IMPLANT
KIT BASIN OR (CUSTOM PROCEDURE TRAY) ×3 IMPLANT
KIT ROOM TURNOVER OR (KITS) ×3 IMPLANT
KNEE CAPITATED TOTAL 3 ×1 IMPLANT
MANIFOLD NEPTUNE II (INSTRUMENTS) ×3 IMPLANT
NEEDLE 22X1 1/2 (OR ONLY) (NEEDLE) ×6 IMPLANT
NS IRRIG 1000ML POUR BTL (IV SOLUTION) ×3 IMPLANT
PACK TOTAL JOINT (CUSTOM PROCEDURE TRAY) ×3 IMPLANT
PACK UNIVERSAL I (CUSTOM PROCEDURE TRAY) IMPLANT
PAD ARMBOARD 7.5X6 YLW CONV (MISCELLANEOUS) ×6 IMPLANT
SET HNDPC FAN SPRY TIP SCT (DISPOSABLE) ×1 IMPLANT
STAPLER VISISTAT 35W (STAPLE) ×3 IMPLANT
STRIP CLOSURE SKIN 1/2X4 (GAUZE/BANDAGES/DRESSINGS) ×2 IMPLANT
SUCTION FRAZIER HANDLE 10FR (MISCELLANEOUS) ×2
SUCTION TUBE FRAZIER 10FR DISP (MISCELLANEOUS) ×1 IMPLANT
SUT BONE WAX W31G (SUTURE) ×3 IMPLANT
SUT MNCRL AB 4-0 PS2 18 (SUTURE) ×3 IMPLANT
SUT VIC AB 0 CTB1 27 (SUTURE) ×6 IMPLANT
SUT VIC AB 1 CT1 27 (SUTURE) ×4
SUT VIC AB 1 CT1 27XBRD ANBCTR (SUTURE) ×2 IMPLANT
SUT VIC AB 2-0 CT1 27 (SUTURE) ×4
SUT VIC AB 2-0 CT1 TAPERPNT 27 (SUTURE) ×2 IMPLANT
SYR 20CC LL (SYRINGE) ×6 IMPLANT
TOWEL OR 17X24 6PK STRL BLUE (TOWEL DISPOSABLE) ×3 IMPLANT
TOWEL OR 17X26 10 PK STRL BLUE (TOWEL DISPOSABLE) ×3 IMPLANT
WATER STERILE IRR 1000ML POUR (IV SOLUTION) ×6 IMPLANT

## 2016-06-01 NOTE — Anesthesia Procedure Notes (Signed)
Spinal  Patient location during procedure: OR End time: 06/01/2016 10:17 AM Staffing Anesthesiologist: Annye Asa Performed: anesthesiologist  Preanesthetic Checklist Completed: patient identified, site marked, surgical consent, pre-op evaluation, timeout performed, IV checked, risks and benefits discussed and monitors and equipment checked Spinal Block Patient position: sitting Prep: Betadine, ChloraPrep and site prepped and draped Patient monitoring: blood pressure, continuous pulse ox, cardiac monitor and heart rate Approach: midline Location: L3-4 Injection technique: single-shot Needle Needle type: Quincke  Needle gauge: 25 G Needle length: 9 cm Additional Notes Pt identified in Operating room.  Monitors applied. Working IV access confirmed. Sterile prep, drape lumbar spine.  1% lido local L 3,4.  #25ga Quincke into clear CSF L 3,4.  12mg  0.75% Bupivacaine with dextrose injected with asp CSF beginning and end of injection.  Patient asymptomatic, VSS, no heme aspirated, tolerated well.  Jenita Seashore, MD

## 2016-06-01 NOTE — Anesthesia Postprocedure Evaluation (Signed)
Anesthesia Post Note  Patient: Barbara Ferguson  Procedure(s) Performed: Procedure(s) (LRB): TOTAL KNEE ARTHROPLASTY (Right)  Patient location during evaluation: PACU Anesthesia Type: Spinal and Regional Level of consciousness: oriented and awake and alert Pain management: pain level controlled Vital Signs Assessment: post-procedure vital signs reviewed and stable Respiratory status: spontaneous breathing, respiratory function stable and patient connected to nasal cannula oxygen Cardiovascular status: blood pressure returned to baseline and stable Postop Assessment: no headache and no backache Anesthetic complications: no    Last Vitals:  Vitals:   06/01/16 1423 06/01/16 1430  BP: 135/71   Pulse: (!) 56   Resp: 14   Temp:  36.3 C    Last Pain:  Vitals:   06/01/16 0901  TempSrc: Oral    LLE Motor Response: Purposeful movement;Responds to commands (wiggles toes. bends knee. lifts hip) (06/01/16 1430)   RLE Motor Response: Purposeful movement;Responds to commands (wiggles toes. in CPM) (06/01/16 1430)   L Sensory Level: S1-Sole of foot, small toes (06/01/16 1430) R Sensory Level: S1-Sole of foot, small toes (06/01/16 1430)  [name]

## 2016-06-01 NOTE — Transfer of Care (Signed)
Immediate Anesthesia Transfer of Care Note  Patient: Barbara Ferguson  Procedure(s) Performed: Procedure(s): TOTAL KNEE ARTHROPLASTY (Right)  Patient Location: PACU  Anesthesia Type:MAC combined with regional for post-op pain  Level of Consciousness: awake, alert , oriented and patient cooperative  Airway & Oxygen Therapy: Patient Spontanous Breathing and Patient connected to nasal cannula oxygen  Post-op Assessment: Report given to RN and Post -op Vital signs reviewed and stable  Post vital signs: Reviewed and stable  Last Vitals:  Vitals:   06/01/16 0901  BP: 127/79  Pulse: (!) 59  Resp: 20  Temp: 36.7 C    Last Pain:  Vitals:   06/01/16 0901  TempSrc: Oral         Complications: No apparent anesthesia complications

## 2016-06-01 NOTE — H&P (Signed)
Barbara Ferguson MRN:  [id number] DOB/SEX:  [birth year]/female  CHIEF COMPLAINT:  Painful right Knee  HISTORY: Patient is a 70 y.o. female presented with a history of pain in the right knee. Onset of symptoms was gradual starting a few years ago with gradually worsening course since that time. Patient has been treated conservatively with over-the-counter NSAIDs and activity modification. Patient currently rates pain in the knee at 10 out of 10 with activity. There is pain at night.  PAST MEDICAL HISTORY: Patient Active Problem List   Diagnosis Date Noted  . 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 Arthritis   . History of colon polyps    benign  . History of kidney stones   . Hypothyroidism    takes SYnthroid daily  . Joint pain   . Joint swelling   . PONV (postoperative nausea and vomiting)    Past Surgical History:  Procedure Laterality Date  . COLONOSCOPY WITH PROPOFOL N/A 09/04/2014   Procedure: COLONOSCOPY WITH PROPOFOL;  Surgeon: Garlan Fair, MD;  Location: [facility];  Service: Endoscopy;  Laterality: N/A;  . DILATION AND CURETTAGE OF UTERUS    . 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 TONSILLECTOMY    . TOTAL KNEE ARTHROPLASTY Left 01/20/2016   Procedure: TOTAL KNEE ARTHROPLASTY;  Surgeon: Vickey Huger, MD;  Location: [facility];  Service: Orthopedics;  Laterality: Left;     MEDICATIONS:   Prescriptions Prior to Admission  Medication Sig Dispense Refill Last Dose  . Glucos-Chondroit-Hyaluron-MSM (GLUCOSAMINE CHONDROITIN JOINT PO) Take 1-2 tablets by mouth See admin instructions. Pt takes 2 tablet in the morning, 1 tablet in the evening   05/31/2016 at Unknown time  . levothyroxine (SYNTHROID, LEVOTHROID) 75 MCG tablet Take 75 mcg by mouth daily before breakfast.   06/01/2016 at Unknown time  . Multiple Vitamins-Minerals (MULTIVITAMIN & MINERAL PO) Take 1 tablet by mouth daily.   05/31/2016 at Unknown time  . enoxaparin (LOVENOX) 40 MG/0.4ML  injection Inject 0.4 mLs (40 mg total) into the skin daily. (Patient not taking: Reported on 05/20/2016) 13 Syringe 0 Not Taking at Unknown time  . methocarbamol (ROBAXIN) 500 MG tablet Take 1-2 tablets (500-1,000 mg total) by mouth every 6 (six) hours as needed for muscle spasms. (Patient not taking: Reported on 05/20/2016) 60 tablet 0 Completed Course at Unknown time  . ondansetron (ZOFRAN) 4 MG tablet Take 1 tablet (4 mg total) by mouth every 6 (six) hours as needed for nausea. (Patient not taking: Reported on 05/20/2016) 20 tablet 0 Not Taking at Unknown time  . oxyCODONE (OXY IR/ROXICODONE) 5 MG immediate release tablet Take 1-2 tablets (5-10 mg total) by mouth every 3 (three) hours as needed for breakthrough pain. (Patient not taking: Reported on 05/20/2016) 60 tablet 0 Completed Course at Unknown time  . oxyCODONE (OXYCONTIN) 10 mg 12 hr tablet Take 1 tablet (10 mg total) by mouth every 12 (twelve) hours. (Patient not taking: Reported on 05/20/2016) 30 tablet 0 Completed Course at Unknown time    ALLERGIES:   Allergies  Allergen Reactions  . No Known Allergies     REVIEW OF SYSTEMS:  A comprehensive review of systems was negative except for: Musculoskeletal: positive for arthralgias   FAMILY HISTORY:  History reviewed. No pertinent family history.  SOCIAL HISTORY:   Social History  Substance Use Topics  . Smoking status: Never Smoker  . Smokeless tobacco: Never Used  . Alcohol use Yes     Comment: wine 1  glass daily     EXAMINATION:  Vital signs in last 24 hours: Temp:  [98 F (36.7 C)] 98 F (36.7 C) (08/28 0901) Pulse Rate:  [59] 59 (08/28 0901) Resp:  [20] 20 (08/28 0901) BP: (127)/(79) 127/79 (08/28 0901) SpO2:  [100 %] 100 % (08/28 0901) Weight:  [54 kg (119 lb)] 54 kg (119 lb) (08/28 0901)  BP 127/79   Pulse (!) 59   Temp 98 F (36.7 C) (Oral)   Resp 20   Ht 5\' 2"  (1.575 m)   Wt 54 kg (119 lb)   SpO2 100%   BMI 21.77 kg/m   General Appearance:    Alert,  cooperative, no distress, appears stated age  Head:    Normocephalic, without obvious abnormality, atraumatic  Eyes:    PERRL, conjunctiva/corneas clear, EOM's intact, fundi    benign, both eyes  Ears:    Normal TM's and external ear canals, both ears  Nose:   Nares normal, septum midline, mucosa normal, no drainage    or sinus tenderness  Throat:   Lips, mucosa, and tongue normal; teeth and gums normal  Neck:   Supple, symmetrical, trachea midline, no adenopathy;    thyroid:  no enlargement/tenderness/nodules; no carotid   bruit or JVD  Back:     Symmetric, no curvature, ROM normal, no CVA tenderness  Lungs:     Clear to auscultation bilaterally, respirations unlabored  Chest Wall:    No tenderness or deformity   Heart:    Regular rate and rhythm, S1 and S2 normal, no murmur, rub   or gallop  Breast Exam:    No tenderness, masses, or nipple abnormality  Abdomen:     Soft, non-tender, bowel sounds active all four quadrants,    no masses, no organomegaly  Genitalia:    Normal female without lesion, discharge or tenderness  Rectal:    Normal tone, no masses or tenderness;   guaiac negative stool  Extremities:   Extremities normal, atraumatic, no cyanosis or edema  Pulses:   2+ and symmetric all extremities  Skin:   Skin color, texture, turgor normal, no rashes or lesions  Lymph nodes:   Cervical, supraclavicular, and axillary nodes normal  Neurologic:   CNII-XII intact, normal strength, sensation and reflexes    throughout     Musculoskeletal:  ROM 0-120, Ligaments intact,  Imaging Review Plain radiographs demonstrate severe degenerative joint disease of the right knee. The overall alignment is neutral. The bone quality appears to be excellent for age and reported activity level.  Assessment/Plan: Primary osteoarthritis, right knee   The patient history, physical examination and imaging studies are consistent with advanced degenerative joint disease of the right knee. The patient  has failed conservative treatment.  The clearance notes were reviewed.  After discussion with the patient it was felt that Total Knee Replacement was indicated. The procedure,  risks, and benefits of total knee arthroplasty were presented and reviewed. The risks including but not limited to aseptic loosening, infection, blood clots, vascular injury, stiffness, patella tracking problems complications among others were discussed. The patient acknowledged the explanation, agreed to proceed with the plan. [name] 06/01/2016, 9:23 AM

## 2016-06-01 NOTE — Progress Notes (Signed)
Orthopedic Tech Progress Note Patient Details:  Lorayne Lapole Dec 28, 1945 [id number]  CPM Right Knee CPM Right Knee: On Right Knee Flexion (Degrees): 0 Right Knee Extension (Degrees): 0 Additional Comments: trapeze bar patient helper   Hildred Priest 06/01/2016, 12:41 PM Viewed order from doctor's order list

## 2016-06-01 NOTE — Anesthesia Procedure Notes (Signed)
Anesthesia Regional Block:  Adductor canal block  Pre-Anesthetic Checklist: ,, timeout performed, Correct Patient, Correct Site, Correct Laterality, Correct Procedure, Correct Position, site marked, Risks and benefits discussed,  Surgical consent,  Pre-op evaluation,  At surgeon's request and post-op pain management  Laterality: Right and Lower  Prep: chloraprep       Needles:  Injection technique: Single-shot  Needle Type: Echogenic Needle     Needle Length: 9cm 9 cm Needle Gauge: 22 and 22 G    Additional Needles:  Procedures: ultrasound guided (picture in chart) Adductor canal block Narrative:  Start time: 06/01/2016 9:56 AM End time: 06/01/2016 10:03 AM Injection made incrementally with aspirations every 5 mL.  Performed by: Personally  Anesthesiologist: Glennon Mac, Deazia Lampi  Additional Notes: Pt identified in Holding room.  Monitors applied. Working IV access confirmed. Sterile prep, drape R thigh.  #22ga ECHOgenic needle into adductor canal with US guidance.  30cc 0.5% Bupivacaine with 1:200k epi injected incrementally after negative test dose, good spread in canal.  Patient asymptomatic, VSS, no heme aspirated, tolerated well.  Jenita Seashore, MD

## 2016-06-01 NOTE — Anesthesia Preprocedure Evaluation (Addendum)
Anesthesia Evaluation  Patient identified by MRN, date of birth, ID band Patient awake    Reviewed: Allergy & Precautions, NPO status , Patient's Chart, lab work & pertinent test results  History of Anesthesia Complications (+) PONV and history of anesthetic complications  Airway Mallampati: I  TM Distance: >3 FB Neck ROM: Full    Dental  (+) Dental Advisory Given, Caps, Teeth Intact   Pulmonary neg pulmonary ROS,    breath sounds clear to auscultation       Cardiovascular negative cardio ROS   Rhythm:Regular Rate:Normal     Neuro/Psych negative neurological ROS     GI/Hepatic negative GI ROS, Neg liver ROS,   Endo/Other  Hypothyroidism Took synthroid this morning  Renal/GU negative Renal ROS     Musculoskeletal  (+) Arthritis , Osteoarthritis,    Abdominal   Peds  Hematology negative hematology ROS (+)   Anesthesia Other Findings   Reproductive/Obstetrics                           Anesthesia Physical Anesthesia Plan  ASA: II  Anesthesia Plan: Spinal   Post-op Pain Management:  Regional for Post-op pain   Induction:   Airway Management Planned: Natural Airway and Simple Face Mask  Additional Equipment:   Intra-op Plan:   Post-operative Plan:   Informed Consent: I have reviewed the patients History and Physical, chart, labs and discussed the procedure including the risks, benefits and alternatives for the proposed anesthesia with the patient or authorized representative who has indicated his/her understanding and acceptance.   Dental advisory given  Plan Discussed with: CRNA and Surgeon  Anesthesia Plan Comments: (Plan routine monitors, SAB with adductor canal block for post op analgesia)        Anesthesia Quick Evaluation

## 2016-06-02 ENCOUNTER — Encounter (HOSPITAL_COMMUNITY): Payer: Self-pay | Admitting: Orthopedic Surgery

## 2016-06-02 LAB — CBC
HEMATOCRIT: 34.5 % — AB (ref 36.0–46.0)
HEMOGLOBIN: 11.3 g/dL — AB (ref 12.0–15.0)
MCH: 29.4 pg (ref 26.0–34.0)
MCHC: 32.8 g/dL (ref 30.0–36.0)
MCV: 89.8 fL (ref 78.0–100.0)
Platelets: 229 10*3/uL (ref 150–400)
RBC: 3.84 MIL/uL — AB (ref 3.87–5.11)
RDW: 14 % (ref 11.5–15.5)
WBC: 9.1 10*3/uL (ref 4.0–10.5)

## 2016-06-02 LAB — BASIC METABOLIC PANEL
Anion gap: 7 (ref 5–15)
BUN: 13 mg/dL (ref 6–20)
CHLORIDE: 104 mmol/L (ref 101–111)
CO2: 24 mmol/L (ref 22–32)
CREATININE: 1.09 mg/dL — AB (ref 0.44–1.00)
Calcium: 8.8 mg/dL — ABNORMAL LOW (ref 8.9–10.3)
GFR calc non Af Amer: 50 mL/min — ABNORMAL LOW (ref >60)
GFR, EST AFRICAN AMERICAN: 58 mL/min — AB (ref >60)
Glucose, Bld: 123 mg/dL — ABNORMAL HIGH (ref 65–99)
POTASSIUM: 4.4 mmol/L (ref 3.5–5.1)
SODIUM: 135 mmol/L (ref 135–145)

## 2016-06-02 MED ORDER — METHOCARBAMOL 500 MG PO TABS: 500.0000 mg | ORAL_TABLET | Freq: Four times a day (QID) | ORAL | 0 refills | Status: DC | PRN

## 2016-06-02 MED ORDER — OXYCODONE HCL 5 MG PO TABS: 5.0000 mg | ORAL_TABLET | ORAL | 0 refills | Status: DC | PRN

## 2016-06-02 MED ORDER — ASPIRIN 325 MG PO TBEC: 325.0000 mg | DELAYED_RELEASE_TABLET | Freq: Two times a day (BID) | ORAL | 0 refills | Status: DC

## 2016-06-02 NOTE — Progress Notes (Signed)
SPORTS MEDICINE AND JOINT REPLACEMENT  Lara Mulch, MD    Carlyon Shadow, PA-C [place], [place], [place]  [address]                             [phone]   PROGRESS NOTE  Subjective:  negative for Chest Pain  negative for Shortness of Breath  negative for Nausea/Vomiting   negative for Calf Pain  negative for Bowel Movement   Tolerating Diet: yes         Patient reports pain as 5 on 0-10 scale.    Objective: Vital signs in last 24 hours:   Patient Vitals for the past 24 hrs:  BP Temp Temp src Pulse Resp SpO2 Height Weight  06/02/16 0422 138/75 98.1 F (36.7 C) Oral 71 15 95 % - -  06/02/16 0135 126/72 97.4 F (36.3 C) Oral 62 15 96 % - -  06/01/16 2018 128/69 98.1 F (36.7 C) Oral 64 15 95 % - -  06/01/16 1518 127/77 97.3 F (36.3 C) Oral 61 14 99 % - -  06/01/16 1500 - 97.3 F (36.3 C) - - - - - -  06/01/16 1453 128/79 - - (!) 56 12 100 % - -  06/01/16 1438 128/78 - - (!) 53 11 100 % - -  06/01/16 1430 - 97.3 F (36.3 C) - - - - - -  06/01/16 1423 135/71 - - (!) 56 14 99 % - -  06/01/16 1408 121/74 - - (!) 59 11 100 % - -  06/01/16 1353 111/81 - - (!) 55 (!) 8 100 % - -  06/01/16 1338 116/81 - - 61 16 100 % - -  06/01/16 1323 109/77 - - (!) 56 13 100 % - -  06/01/16 1308 101/87 - - 62 (!) 24 100 % - -  06/01/16 1253 105/66 - - (!) 53 13 99 % - -  06/01/16 1238 106/67 - - (!) 57 (!) 9 100 % - -  06/01/16 1228 98/65 - - (!) 56 10 100 % - -  06/01/16 1223 97/63 - - 62 15 100 % - -  06/01/16 1210 101/63 97.2 F (36.2 C) - 65 20 100 % - -  06/01/16 0901 127/79 98 F (36.7 C) Oral (!) 59 20 100 % 5\' 2"  (1.575 m) 54 kg (119 lb)    @flow {1959:LAST@   Intake/Output from previous day:   08/28 0701 - 08/29 0700 In: 2260 [I.V.:2150] Out: 425 [Urine:400]   Intake/Output this shift:   No intake/output data recorded.   Intake/Output      08/28 0701 - 08/29 0700 08/29 0701 - 08/30 0700   I.V. (mL/kg) 2150 (39.8)    IV Piggyback 110    Total  Intake(mL/kg) 2260 (41.9)    Urine (mL/kg/hr) 400    Blood 25    Total Output 425     Net +1835          Urine Occurrence 5 x       LABORATORY DATA:  Recent Labs  06/02/16 0500  WBC 9.1  HGB 11.3*  HCT 34.5*  PLT 229    Recent Labs  06/02/16 0500  NA 135  K 4.4  CL 104  CO2 24  BUN 13  CREATININE 1.09*  GLUCOSE 123*  CALCIUM 8.8*   Lab Results  Component Value Date   INR 1.07 05/21/2016   INR 1.04 01/09/2016  Examination:  General appearance: alert, cooperative and no distress Extremities: extremities normal, atraumatic, no cyanosis or edema  Wound Exam: clean, dry, intact   Drainage:  None: wound tissue dry  Motor Exam: Quadriceps and Hamstrings Intact  Sensory Exam: Superficial Peroneal, Deep Peroneal and Tibial normal   Assessment:    1 Day Post-Op  Procedure(s) (LRB): TOTAL KNEE ARTHROPLASTY (Right)  ADDITIONAL DIAGNOSIS:  Active Problems:   S/P total knee replacement  Acute Blood Loss Anemia   Plan: Physical Therapy as ordered Weight Bearing as Tolerated (WBAT)  DVT Prophylaxis:  Aspirin  DISCHARGE PLAN: Home  DISCHARGE NEEDS: HHPT   Patient looks great. Will D/C today         [name] 06/02/2016, 7:13 AM

## 2016-06-02 NOTE — Discharge Summary (Signed)
SPORTS MEDICINE & JOINT REPLACEMENT   Lara Mulch, MD   Carlyon Shadow, PA-C [address], [place], [place]  [address]                             [phone]  PATIENT ID: Luwanda Pride        MRN:  [id number]          DOB/AGE: [birth year] / 70 y.o.    DISCHARGE SUMMARY  ADMISSION DATE:    06/01/2016 DISCHARGE DATE:   06/02/2016   ADMISSION DIAGNOSIS: primary osteoarthritis right knee    DISCHARGE DIAGNOSIS:  primary osteoarthritis right knee    ADDITIONAL DIAGNOSIS: Active Problems:   S/P total knee replacement  Past Medical History:  Diagnosis Date  . Arthritis   . History of colon polyps    benign  . History of kidney stones   . Hypothyroidism    takes SYnthroid daily  . Joint pain   . Joint swelling   . PONV (postoperative nausea and vomiting)     PROCEDURE: Procedure(s): TOTAL KNEE ARTHROPLASTY on 06/01/2016  CONSULTS:    HISTORY:  See H&P in chart  HOSPITAL COURSE:  Arayiah Mancill is a 70 y.o. admitted on 06/01/2016 and found to have a diagnosis of primary osteoarthritis right knee.  After appropriate laboratory studies were obtained  they were taken to the operating room on 06/01/2016 and underwent Procedure(s): TOTAL KNEE ARTHROPLASTY.   They were given perioperative antibiotics:  Anti-infectives    Start     Dose/Rate Route Frequency Ordered Stop   06/01/16 1700  ceFAZolin (ANCEF) IVPB 1 g/50 mL premix     1 g 100 mL/hr over 30 Minutes Intravenous Every 6 hours 06/01/16 1530 06/02/16 0037   06/01/16 1000  ceFAZolin (ANCEF) IVPB 2g/100 mL premix     2 g 200 mL/hr over 30 Minutes Intravenous To [facility] 05/29/16 2009 06/01/16 1015    .  Patient given tranexamic acid IV or topical and exparel intra-operatively.  Tolerated the procedure well.    POD# 1: Vital signs were stable.  Patient denied Chest pain, shortness of breath, or calf pain.  Patient was started on Lovenox 30 mg subcutaneously twice daily at 8am.  Consults to PT, OT, and care  management were made.  The patient was weight bearing as tolerated.  CPM was placed on the operative leg 0-90 degrees for 6-8 hours a day. When out of the CPM, patient was placed in the foam block to achieve full extension. Incentive spirometry was taught.  Dressing was changed.       POD #2, Continued  PT for ambulation and exercise program.  IV saline locked.  O2 discontinued.    The remainder of the hospital course was dedicated to ambulation and strengthening.   The patient was discharged on 1 Day Post-Op in  Good condition.  Blood products given:none  DIAGNOSTIC STUDIES: Recent vital signs: Patient Vitals for the past 24 hrs:  BP Temp Temp src Pulse Resp SpO2  06/02/16 0422 138/75 98.1 F (36.7 C) Oral 71 15 95 %  06/02/16 0135 126/72 97.4 F (36.3 C) Oral 62 15 96 %  06/01/16 2018 128/69 98.1 F (36.7 C) Oral 64 15 95 %  06/01/16 1518 127/77 97.3 F (36.3 C) Oral 61 14 99 %       Recent laboratory studies:  Recent Labs  06/02/16 0500  WBC 9.1  HGB 11.3*  HCT 34.5*  PLT 229    Recent Labs  06/02/16 0500  NA 135  K 4.4  CL 104  CO2 24  BUN 13  CREATININE 1.09*  GLUCOSE 123*  CALCIUM 8.8*   Lab Results  Component Value Date   INR 1.07 05/21/2016   INR 1.04 01/09/2016     Recent Radiographic Studies :  No results found.  DISCHARGE INSTRUCTIONS: Discharge Instructions    CPM    Complete by:  As directed   Continuous passive motion machine (CPM):      Use the CPM from 0 to 90 for 4-6 hours per day.      You may increase by 10 per day.  You may break it up into 2 or 3 sessions per day.      Use CPM for 2 weeks or until you are told to stop.   Call MD / Call 911    Complete by:  As directed   If you experience chest pain or shortness of breath, CALL 911 and be transported to the hospital emergency room.  If you develope a fever above 101 F, pus (white drainage) or increased drainage or redness at the wound, or calf pain, call your surgeon's office.    Constipation Prevention    Complete by:  As directed   Drink plenty of fluids.  Prune juice may be helpful.  You may use a stool softener, such as Colace (over the counter) 100 mg twice a day.  Use MiraLax (over the counter) for constipation as needed.   Diet - low sodium heart healthy    Complete by:  As directed   Discharge instructions    Complete by:  As directed   INSTRUCTIONS AFTER JOINT REPLACEMENT   Remove items at home which could result in a fall. This includes throw rugs or furniture in walking pathways ICE to the affected joint every three hours while awake for 30 minutes at a time, for at least the first 3-5 days, and then as needed for pain and swelling.  Continue to use ice for pain and swelling. You may notice swelling that will progress down to the foot and ankle.  This is normal after surgery.  Elevate your leg when you are not up walking on it.   Continue to use the breathing machine you got in the hospital (incentive spirometer) which will help keep your temperature down.  It is common for your temperature to cycle up and down following surgery, especially at night when you are not up moving around and exerting yourself.  The breathing machine keeps your lungs expanded and your temperature down.   DIET:  As you were doing prior to hospitalization, we recommend a well-balanced diet.  DRESSING / WOUND CARE / SHOWERING  Keep the surgical dressing until follow up.  The dressing is water proof, so you can shower without any extra covering.  IF THE DRESSING FALLS OFF or the wound gets wet inside, change the dressing with sterile gauze.  Please use good hand washing techniques before changing the dressing.  Do not use any lotions or creams on the incision until instructed by your surgeon.    ACTIVITY  Increase activity slowly as tolerated, but follow the weight bearing instructions below.   No driving for 6 weeks or until further direction given by your physician.  You cannot drive  while taking narcotics.  No lifting or carrying greater than 10 lbs. until further directed by your surgeon. Avoid periods of inactivity such  as sitting longer than an hour when not asleep. This helps prevent blood clots.  You may return to work once you are authorized by your doctor.     WEIGHT BEARING   Weight bearing as tolerated with assist device (walker, cane, etc) as directed, use it as long as suggested by your surgeon or therapist, typically at least 4-6 weeks.   EXERCISES  Results after joint replacement surgery are often greatly improved when you follow the exercise, range of motion and muscle strengthening exercises prescribed by your doctor. Safety measures are also important to protect the joint from further injury. Any time any of these exercises cause you to have increased pain or swelling, decrease what you are doing until you are comfortable again and then slowly increase them. If you have problems or questions, call your caregiver or physical therapist for advice.   Rehabilitation is important following a joint replacement. After just a few days of immobilization, the muscles of the leg can become weakened and shrink (atrophy).  These exercises are designed to build up the tone and strength of the thigh and leg muscles and to improve motion. Often times heat used for twenty to thirty minutes before working out will loosen up your tissues and help with improving the range of motion but do not use heat for the first two weeks following surgery (sometimes heat can increase post-operative swelling).   These exercises can be done on a training (exercise) mat, on the floor, on a table or on a bed. Use whatever works the best and is most comfortable for you.    Use music or television while you are exercising so that the exercises are a pleasant break in your day. This will make your life better with the exercises acting as a break in your routine that you can look forward to.   Perform  all exercises about fifteen times, three times per day or as directed.  You should exercise both the operative leg and the other leg as well.   Exercises include:   Quad Sets - Tighten up the muscle on the front of the thigh (Quad) and hold for 5-10 seconds.   Straight Leg Raises - With your knee straight (if you were given a brace, keep it on), lift the leg to 60 degrees, hold for 3 seconds, and slowly lower the leg.  Perform this exercise against resistance later as your leg gets stronger.  Leg Slides: Lying on your back, slowly slide your foot toward your buttocks, bending your knee up off the floor (only go as far as is comfortable). Then slowly slide your foot back down until your leg is flat on the floor again.  Angel Wings: Lying on your back spread your legs to the side as far apart as you can without causing discomfort.  Hamstring Strength:  Lying on your back, push your heel against the floor with your leg straight by tightening up the muscles of your buttocks.  Repeat, but this time bend your knee to a comfortable angle, and push your heel against the floor.  You may put a pillow under the heel to make it more comfortable if necessary.   A rehabilitation program following joint replacement surgery can speed recovery and prevent re-injury in the future due to weakened muscles. Contact your doctor or a physical therapist for more information on knee rehabilitation.    CONSTIPATION  Constipation is defined medically as fewer than three stools per week and severe constipation as less  than one stool per week.  Even if you have a regular bowel pattern at home, your normal regimen is likely to be disrupted due to multiple reasons following surgery.  Combination of anesthesia, postoperative narcotics, change in appetite and fluid intake all can affect your bowels.   YOU MUST use at least one of the following options; they are listed in order of increasing strength to get the job done.  They are  all available over the counter, and you may need to use some, POSSIBLY even all of these options:    Drink plenty of fluids (prune juice may be helpful) and high fiber foods Colace 100 mg by mouth twice a day  Senokot for constipation as directed and as needed Dulcolax (bisacodyl), take with full glass of water  Miralax (polyethylene glycol) once or twice a day as needed.  If you have tried all these things and are unable to have a bowel movement in the first 3-4 days after surgery call either your surgeon or your primary doctor.    If you experience loose stools or diarrhea, hold the medications until you stool forms back up.  If your symptoms do not get better within 1 week or if they get worse, check with your doctor.  If you experience "the worst abdominal pain ever" or develop nausea or vomiting, please contact the office immediately for further recommendations for treatment.   ITCHING:  If you experience itching with your medications, try taking only a single pain pill, or even half a pain pill at a time.  You can also use Benadryl over the counter for itching or also to help with sleep.   TED HOSE STOCKINGS:  Use stockings on both legs until for at least 2 weeks or as directed by physician office. They may be removed at night for sleeping.  MEDICATIONS:  See your medication summary on the "After Visit Summary" that nursing will review with you.  You may have some home medications which will be placed on hold until you complete the course of blood thinner medication.  It is important for you to complete the blood thinner medication as prescribed.  PRECAUTIONS:  If you experience chest pain or shortness of breath - call 911 immediately for transfer to the hospital emergency department.   If you develop a fever greater that 101 F, purulent drainage from wound, increased redness or drainage from wound, foul odor from the wound/dressing, or calf pain - CONTACT YOUR SURGEON.                                                    FOLLOW-UP APPOINTMENTS:  If you do not already have a post-op appointment, please call the office for an appointment to be seen by your surgeon.  Guidelines for how soon to be seen are listed in your "After Visit Summary", but are typically between 1-4 weeks after surgery.  OTHER INSTRUCTIONS:   Knee Replacement:  Do not place pillow under knee, focus on keeping the knee straight while resting. CPM instructions: 0-90 degrees, 2 hours in the morning, 2 hours in the afternoon, and 2 hours in the evening. Place foam block, curve side up under heel at all times except when in CPM or when walking.  DO NOT modify, tear, cut, or change the foam block in any way.  MAKE  SURE YOU:  Understand these instructions.  Get help right away if you are not doing well or get worse.    Thank you for letting us be a part of your medical care team.  It is a privilege we respect greatly.  We hope these instructions will help you stay on track for a fast and full recovery!   Increase activity slowly as tolerated    Complete by:  As directed      DISCHARGE MEDICATIONS:     Medication List    STOP taking these medications   enoxaparin 40 MG/0.4ML injection Commonly known as:  LOVENOX   GLUCOSAMINE CHONDROITIN JOINT PO     TAKE these medications   aspirin 325 MG EC tablet Take 1 tablet (325 mg total) by mouth 2 (two) times daily.   levothyroxine 75 MCG tablet Commonly known as:  SYNTHROID, LEVOTHROID Take 75 mcg by mouth daily before breakfast.   methocarbamol 500 MG tablet Commonly known as:  ROBAXIN Take 1-2 tablets (500-1,000 mg total) by mouth every 6 (six) hours as needed for muscle spasms.   MULTIVITAMIN & MINERAL PO Take 1 tablet by mouth daily.   ondansetron 4 MG tablet Commonly known as:  ZOFRAN Take 1 tablet (4 mg total) by mouth every 6 (six) hours as needed for nausea.   oxyCODONE 5 MG immediate release tablet Commonly known as:  Oxy IR/ROXICODONE Take  1-2 tablets (5-10 mg total) by mouth every 3 (three) hours as needed for breakthrough pain. What changed:  Another medication with the same name was removed. Continue taking this medication, and follow the directions you see here.       FOLLOW UP VISIT:    DISPOSITION: HOME VS. SNF  CONDITION:  Good   [name] 06/02/2016, 3:17 PM

## 2016-06-02 NOTE — Evaluation (Signed)
Occupational Therapy Evaluation Patient Details Name: Barbara Ferguson MRN: [id number] DOB: 1946/03/25 Today's Date: 06/02/2016    History of Present Illness 70 y.o. female now s/p Rt TKA. PMH: Lt TKA.    Clinical Impression   Pt was admitted for the above sx. All education was reviewed and completed.  No further OT needs at this time    Follow Up Recommendations  Supervision/Assistance - 24 hour    Equipment Recommendations  None recommended by OT    Recommendations for Other Services       Precautions / Restrictions Precautions Precautions: Fall;Knee Precaution Booklet Issued: Yes (comment) Precaution Comments: HEP provided, reviewed knee extension precautions.  Restrictions Weight Bearing Restrictions: Yes RLE Weight Bearing: Weight bearing as tolerated      Mobility Bed Mobility Overal bed mobility: Needs Assistance Bed Mobility: Supine to Sit     Supine to sit: Supervision Sit to supine: Supervision   General bed mobility comments: pt managed RLE  Transfers Overall transfer level: Needs assistance Equipment used: Rolling walker (2 wheeled) Transfers: Sit to/from Stand Sit to Stand: Supervision         General transfer comment: cues for UE placement when getting on/off toilet    Balance Overall balance assessment: Needs assistance Sitting-balance support: No upper extremity supported Sitting balance-Leahy Scale: Good     Standing balance support: Bilateral upper extremity supported Standing balance-Leahy Scale: Poor Standing balance comment: using rw                            ADL Overall ADL's : Needs assistance/impaired     Grooming: Wash/dry hands;Supervision/safety;Standing                   Toilet Transfer: Min guard;Ambulation;Comfort height toilet;RW   Toileting- Clothing Manipulation and Hygiene: Supervision/safety;Sit to/from stand   Tub/ Shower Transfer: Walk-in shower;Min guard;Ambulation     General ADL  Comments: reviewed bathroom transfers:  min guard for safety when ambulating.  Pt will have assistance as needed for adls.  Did not want to complete this am.     Vision     Perception     Praxis      Pertinent Vitals/Pain Pain Assessment: 0-10 Pain Score: 2  Pain Location: R knee Pain Descriptors / Indicators: Sore Pain Intervention(s): Limited activity within patient's tolerance;Monitored during session;Premedicated before session;Repositioned;Ice applied     Hand Dominance     Extremity/Trunk Assessment Upper Extremity Assessment Upper Extremity Assessment: Overall WFL for tasks assessed          Communication Communication Communication: No difficulties   Cognition Arousal/Alertness: Awake/alert Behavior During Therapy: WFL for tasks assessed/performed Overall Cognitive Status: Within Functional Limits for tasks assessed                     General Comments       Exercises       Shoulder Instructions      Home Living Family/patient expects to be discharged to:: Private residence Living Arrangements: Spouse/significant other Available Help at Discharge: Family;Available 24 hours/day Type of Home: House Home Access: Level entry     Home Layout: One level     Bathroom Shower/Tub: Occupational psychologist: Standard     Home Equipment: Environmental consultant - 2 wheels;Shower seat   Additional Comments: reports having equipment from last surgery.       Prior Functioning/Environment Level of Independence: Independent  OT Diagnosis: Acute pain   OT Problem List:     OT Treatment/Interventions:      OT Goals(Current goals can be found in the care plan section) Acute Rehab OT Goals Patient Stated Goal: be active OT Goal Formulation: All assessment and education complete, DC therapy  OT Frequency:     Barriers to D/C:            Co-evaluation              End of Session CPM Right Knee CPM Right Knee: Off Nurse  Communication:  (needs new pulse ox box:  keeps beeping with VSS)  Activity Tolerance: Patient tolerated treatment well Patient left: in bed;with call bell/phone within reach   Time: [id number] OT Time Calculation (min): 18 min Charges:  OT General Charges $OT Visit: 1 Procedure OT Evaluation $OT Eval Low Complexity: 1 Procedure G-Codes:    Tory Mckissack 06-24-16, 9:52 AM Lesle Chris, OTR/L [phone] 24-Jun-2016

## 2016-06-02 NOTE — Progress Notes (Signed)
Pt discharged to home via wheelchair without incident per MD order accompanied by family members. Prior to discharge all d/c teachings done both written and verbal. Pt verb understanding and agrees to comply. All questions answered. Pt pain tolerable upon discharge. No change from AM assessment on discharge.

## 2016-06-02 NOTE — Progress Notes (Signed)
Pt was given 2 oxy ER at 1730 for c/o pain .  Pt stated previously that she was starting to have some feeling (1/10)  in her leg/knee and did not want to wait until it was really hurting but questioned whether it was really pain.  Agreed that I would get pain med for her to stay ahead of the pain.

## 2016-06-02 NOTE — Evaluation (Signed)
Physical Therapy Evaluation Patient Details Name: Barbara Ferguson MRN: [id number] DOB: 1945-11-21 Today's Date: 06/02/2016   History of Present Illness  70 y.o. female now s/p Rt TKA. PMH: Lt TKA.   Clinical Impression  Pt is s/p TKA resulting in the deficits listed below (see PT Problem List).  Pt will benefit from skilled PT to increase their independence and safety with mobility to allow discharge to home with family assist. Pt did complain of moderate nausea during session, recently medicated for this. Pt reports improving with time.      Follow Up Recommendations Home health PT;Supervision for mobility/OOB    Equipment Recommendations  None recommended by PT    Recommendations for Other Services       Precautions / Restrictions Precautions Precautions: Fall;Knee Precaution Booklet Issued: Yes (comment) Precaution Comments: HEP provided, reviewed knee extension precautions.  Restrictions Weight Bearing Restrictions: Yes RLE Weight Bearing: Weight bearing as tolerated      Mobility  Bed Mobility Overal bed mobility: Needs Assistance Bed Mobility: Supine to Sit     Supine to sit: Supervision     General bed mobility comments: supervision for safety  Transfers Overall transfer level: Needs assistance Equipment used: Rolling walker (2 wheeled) Transfers: Sit to/from Stand Sit to Stand: Supervision         General transfer comment: good technique demonstrated.   Ambulation/Gait Ambulation/Gait assistance: Min guard Ambulation Distance (Feet): 150 Feet Assistive device: Rolling walker (2 wheeled) Gait Pattern/deviations: Step-through pattern;Decreased weight shift to right Gait velocity: decreased   General Gait Details: working on even weightbearing.   Stairs            Wheelchair Mobility    Modified Rankin (Stroke Patients Only)       Balance Overall balance assessment: Needs assistance Sitting-balance support: No upper extremity  supported Sitting balance-Leahy Scale: Good     Standing balance support: Bilateral upper extremity supported Standing balance-Leahy Scale: Poor Standing balance comment: using rw                             Pertinent Vitals/Pain Pain Assessment: 0-10 Pain Score: 3  Pain Location: Rt knee  Pain Descriptors / Indicators: Aching Pain Intervention(s): Limited activity within patient's tolerance;Monitored during session;Ice applied    Home Living Family/patient expects to be discharged to:: Private residence Living Arrangements: Spouse/significant other Available Help at Discharge: Family;Available 24 hours/day Type of Home: House Home Access: Level entry     Home Layout: One level Home Equipment: Walker - 2 wheels Additional Comments: reports having equipment from last surgery.     Prior Function Level of Independence: Independent               Hand Dominance        Extremity/Trunk Assessment   Upper Extremity Assessment: Defer to OT evaluation           Lower Extremity Assessment: RLE deficits/detail RLE Deficits / Details: able to perform SLR        Communication   Communication: No difficulties  Cognition Arousal/Alertness: Awake/alert Behavior During Therapy: WFL for tasks assessed/performed Overall Cognitive Status: Within Functional Limits for tasks assessed                      General Comments      Exercises        Assessment/Plan    PT Assessment Patient needs continued PT services  PT Diagnosis Difficulty walking  PT Problem List Decreased strength;Decreased range of motion;Decreased activity tolerance;Decreased balance;Decreased mobility  PT Treatment Interventions DME instruction;Gait training;Stair training;Functional mobility training;Therapeutic activities;Therapeutic exercise;Patient/family education   PT Goals (Current goals can be found in the Care Plan section) Acute Rehab PT Goals Patient Stated Goal:  be active PT Goal Formulation: With patient Time For Goal Achievement: 06/16/16 Potential to Achieve Goals: Good    Frequency 7X/week   Barriers to discharge        Co-evaluation               End of Session Equipment Utilized During Treatment: Gait belt Activity Tolerance: Patient tolerated treatment well Patient left: in chair;with call bell/phone within reach (in bone foam) Nurse Communication: Mobility status    Functional Assessment Tool Used: clinical judgment Functional Limitation: Mobility: Walking and moving around Mobility: Walking and Moving Around Current Status [id number]): At least 20 percent but less than 40 percent impaired, limited or restricted Mobility: Walking and Moving Around Goal Status [phone]): At least 1 percent but less than 20 percent impaired, limited or restricted    Time: 0824-0849 PT Time Calculation (min) (ACUTE ONLY): 25 min   Charges:   PT Evaluation $PT Eval Moderate Complexity: 1 Procedure PT Treatments $Gait Training: 8-22 mins   PT G Codes:   PT G-Codes **NOT FOR INPATIENT CLASS** Functional Assessment Tool Used: clinical judgment Functional Limitation: Mobility: Walking and moving around Mobility: Walking and Moving Around Current Status [id number]): At least 20 percent but less than 40 percent impaired, limited or restricted Mobility: Walking and Moving Around Goal Status [phone]): At least 1 percent but less than 20 percent impaired, limited or restricted    Cassell Clement, PT, [place] Pager [phone] Office [phone]  06/02/2016, 8:58 AM

## 2016-06-02 NOTE — Op Note (Signed)
TOTAL KNEE REPLACEMENT OPERATIVE NOTE:  06/01/2016  9:08 AM  PATIENT:  Barbara Ferguson  70 y.o. female  PRE-OPERATIVE DIAGNOSIS:  primary osteoarthritis right knee  POST-OPERATIVE DIAGNOSIS:  primary osteoarthritis right knee  PROCEDURE:  Procedure(s): TOTAL KNEE ARTHROPLASTY  SURGEON:  Surgeon(s): Vickey Huger, MD  PHYSICIAN ASSISTANT: Nehemiah Massed, [facility]  ANESTHESIA:   spinal  DRAINS: Hemovac  SPECIMEN: None  COUNTS:  Correct  TOURNIQUET:   Total Tourniquet Time Documented: Thigh (Right) - 46 minutes Total: Thigh (Right) - 46 minutes   DICTATION:  Indication for procedure:    The patient is a 70 y.o. female who has failed conservative treatment for primary osteoarthritis right knee.  Informed consent was obtained prior to anesthesia. The risks versus benefits of the operation were explain and in a way the patient can, and did, understand.   On the implant demand matching protocol, this patient scored 10.  Therefore, this patient did" "did not receive a polyethylene insert with vitamin E which is a high demand implant.  Description of procedure:     The patient was taken to the operating room and placed under anesthesia.  The patient was positioned in the usual fashion taking care that all body parts were adequately padded and/or protected.  I foley catheter was not placed.  A tourniquet was applied and the leg prepped and draped in the usual sterile fashion.  The extremity was exsanguinated with the esmarch and tourniquet inflated to 350 mmHg.  Pre-operative range of motion was normal.  The knee was in 3 degree of mild varus.  A midline incision approximately 6-7 inches long was made with a #10 blade.  A new blade was used to make a parapatellar arthrotomy going 2-3 cm into the quadriceps tendon, over the patella, and alongside the medial aspect of the patellar tendon.  A synovectomy was then performed with the #10 blade and forceps. I then elevated the deep MCL off the  medial tibial metaphysis subperiosteally around to the semimembranosus attachment.    I everted the patella and used calipers to measure patellar thickness.  I used the reamer to ream down to appropriate thickness to recreate the native thickness.  I then removed excess bone with the rongeur and sagittal saw.  I used the appropriately sized template and drilled the three lug holes.  I then put the trial in place and measured the thickness with the calipers to ensure recreation of the native thickness.  The trial was then removed and the patella subluxed and the knee brought into flexion.  A homan retractor was place to retract and protect the patella and lateral structures.  A Z-retractor was place medially to protect the medial structures.  The extra-medullary alignment system was used to make cut the tibial articular surface perpendicular to the anamotic axis of the tibia and in 3 degrees of posterior slope.  The cut surface and alignment jig was removed.  I then used the intramedullary alignment guide to make a 6 valgus cut on the distal femur.  I then marked out the epicondylar axis on the distal femur.  The posterior condylar axis measured 3 degrees.  I then used the anterior referencing sizer and measured the femur to be a size 6.  The 4-In-1 cutting block was screwed into place in external rotation matching the posterior condylar angle, making our cuts perpendicular to the epicondylar axis.  Anterior, posterior and chamfer cuts were made with the sagittal saw.  The cutting block and cut pieces were  removed.  A lamina spreader was placed in 90 degrees of flexion.  The ACL, PCL, menisci, and posterior condylar osteophytes were removed.  A 10 mm spacer blocked was found to offer good flexion and extension gap balance after moderate in degree releasing.   The scoop retractor was then placed and the femoral finishing block was pinned in place.  The small sagittal saw was used as well as the lug drill to  finish the femur.  The block and cut surfaces were removed and the medullary canal hole filled with autograft bone from the cut pieces.  The tibia was delivered forward in deep flexion and external rotation.  A size C tray was selected and pinned into place centered on the medial 1/3 of the tibial tubercle.  The reamer and keel was used to prepare the tibia through the tray.    I then trialed with the size 6 femur, size C tibia, a 10 mm insert and the 30 patella.  I had excellent flexion/extension gap balance, excellent patella tracking.  Flexion was full and beyond 120 degrees; extension was zero.  These components were chosen and the staff opened them to me on the back table while the knee was lavaged copiously and the cement mixed.  The soft tissue was infiltrated with 60cc of exparel 1.3% through a 21 gauge needle.  I cemented in the components and removed all excess cement.  The polyethylene tibial component was snapped into place and the knee placed in extension while cement was hardening.  The capsule was infilltrated with 30cc of .25% Marcaine with epinephrine.  A hemovac was place in the joint exiting superolaterally.  A pain pump was place superomedially superficial to the arthrotomy.  Once the cement was hard, the tourniquet was let down.  Hemostasis was obtained.  The arthrotomy was closed with figure-8 #1 vicryl sutures.  The deep soft tissues were closed with #0 vicryls and the subcuticular layer closed with a running #2-0 vicryl.  The skin was reapproximated and closed with skin staples.  The wound was dressed with xeroform, 4 x4's, 2 ABD sponges, a single layer of webril and a TED stocking.   The patient was then awakened, extubated, and taken to the recovery room in stable condition.  BLOOD LOSS:  300cc DRAINS: 1 hemovac, 1 pain catheter COMPLICATIONS:  None.  PLAN OF CARE: Admit to inpatient   PATIENT DISPOSITION:  PACU - hemodynamically stable.   Delay start of Pharmacological  VTE agent (>24hrs) due to surgical blood loss or risk of bleeding:  not applicable  Please fax a copy of this op note to my office at [phone] (please only include page 1 and 2 of the Case Information op note)

## 2016-10-05 HISTORY — PX: BREAST LUMPECTOMY: SHX2

## 2017-07-09 ENCOUNTER — Other Ambulatory Visit: Payer: Self-pay | Admitting: Internal Medicine

## 2017-07-09 DIAGNOSIS — R921 Mammographic calcification found on diagnostic imaging of breast: Secondary | ICD-10-CM

## 2017-07-19 ENCOUNTER — Other Ambulatory Visit: Payer: Self-pay | Admitting: Internal Medicine

## 2017-07-19 ENCOUNTER — Ambulatory Visit
Admission: RE | Admit: 2017-07-19 | Discharge: 2017-07-19 | Disposition: A | Discharge status: Home / Self Care | Payer: Medicare Other | Source: Ambulatory Visit | Attending: Internal Medicine | Admitting: Internal Medicine

## 2017-07-19 DIAGNOSIS — R921 Mammographic calcification found on diagnostic imaging of breast: Secondary | ICD-10-CM

## 2017-07-23 ENCOUNTER — Ambulatory Visit
Admission: RE | Admit: 2017-07-23 | Discharge: 2017-07-23 | Disposition: A | Discharge status: Home / Self Care | Payer: Medicare Other | Source: Ambulatory Visit | Attending: Internal Medicine | Admitting: Internal Medicine

## 2017-07-23 DIAGNOSIS — R921 Mammographic calcification found on diagnostic imaging of breast: Secondary | ICD-10-CM

## 2017-07-27 ENCOUNTER — Telehealth: Payer: Self-pay | Admitting: Oncology

## 2017-07-27 NOTE — Telephone Encounter (Signed)
Confirmed [facility] morning appointment on 08/04/17 with patient, intake form emailed to patient

## 2017-07-28 ENCOUNTER — Encounter: Payer: Self-pay | Admitting: *Deleted

## 2017-07-28 DIAGNOSIS — D0512 Intraductal carcinoma in situ of left breast: Secondary | ICD-10-CM

## 2017-07-28 HISTORY — DX: Intraductal carcinoma in situ of left breast: D05.12

## 2017-08-02 NOTE — Progress Notes (Signed)
[place]  [phone] [phone]     ID: Barbara Ferguson DOB: [birth year]  MR#: [id number]  JYN#:[id number]  Patient Care Team: Leeroy Cha, MD as PCP - General (Internal Medicine) Eppie Gibson, MD as Attending Physician (Radiation Oncology) Jaidalyn Schillo, Virgie Dad, MD as Consulting Physician (Oncology) Martinique, Amy, MD as Consulting Physician (Dermatology) Vickey Huger, MD as Consulting Physician (Orthopedic Surgery) OTHER MD:  CHIEF COMPLAINT: Ductal carcinoma in situ, estrogen receptor positive  CURRENT TREATMENT: Awaiting definitive surgery   HISTORY OF CURRENT ILLNESS: Barbara Ferguson had screening mammography 72/27/2015 at [place] with results showing: An area of suspicious calcifications in the left breast.. On 12/08/2013, she had a unilateral left mammography at [place] with results showing: Breast density category D. Likely benign calcifications in the inner left breast, middle 1/3. At this visit, it was recommended that the patient follow up in 6 months for a diagnostic left mammogram with spot magnifications.  However the patient did not show for follow-up  More recently bilateral diagnostic mammography with tomography at [facility]  07/19/2017 showed increasing indeterminate calcifications within the lower slightly inner left breast. No mammographic evidence of right breast malignancy. Biopsy of the breast lesion in question on 07/23/2017 showed ([id number]) ductal carcinoma in situ, grade 2 to 3. Suspicious for focal microscopic invasion. Prognostic indicators showed: ER 100% positive with strong staining intensity and PR with 90% positive with strong staining intensity.   The patient's subsequent history is as detailed below.  INTERVAL HISTORY: Barbara Ferguson was evaluated in the multidisciplinary breast cancer clinic October 72, 2018 accompanied by her husband Barbara Ferguson. Her case was also presented at the multidisciplinary breast  cancer conference on the same day. At that time a preliminary plan was proposed: Breast conserving surgery with sentinel lymph node sampling, followed by adjuvant radiation, consideration of antiestrogens.  REVIEW OF SYSTEMS: [name] reports that she competes yoga, tennis, and sometimes pickleball as her form of exercise. The patient denies unusual headaches, visual changes, nausea, vomiting, stiff neck, dizziness, or gait imbalance. There has been no cough, phlegm production, or pleurisy, no chest pain or pressure, and no change in bowel or bladder habits. The patient denies fever, rash, bleeding, unexplained fatigue or unexplained weight loss. A detailed review of systems was otherwise entirely negative.   PAST MEDICAL HISTORY: Past Medical History:  Diagnosis Date  . Arthritis   . 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 History of colon polyps    benign  . History of kidney stones   . Hypothyroidism    takes SYnthroid daily  . Joint pain   . Joint swelling   . PONV (postoperative nausea and vomiting)     PAST SURGICAL HISTORY: Past Surgical History:  Procedure Laterality Date  . COLONOSCOPY WITH PROPOFOL N/A 09/04/2014   Procedure: COLONOSCOPY WITH PROPOFOL;  Surgeon: Garlan Fair, MD;  Location: [facility];  Service: Endoscopy;  Laterality: N/A;  . DILATION AND CURETTAGE OF UTERUS    . 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 TONSILLECTOMY    . TOTAL KNEE ARTHROPLASTY Left 01/20/2016   Procedure: TOTAL KNEE ARTHROPLASTY;  Surgeon: Vickey Huger, MD;  Location: [place];  Service: Orthopedics;  Laterality: Left;  . TOTAL KNEE ARTHROPLASTY Right 06/01/2016   Procedure: TOTAL KNEE ARTHROPLASTY;  Surgeon: Vickey Huger, MD;  Location: [place];  Service: Orthopedics;  Laterality: Right;    FAMILY HISTORY Family History  Problem Relation Age of Onset  . Breast cancer Sister  Her father died at age 50 from old age and her mother died at age 43 from a stroke. She has one  brother and one sister. Her sister was diagnosed at age 52 with breast cancer.    GYNECOLOGIC HISTORY:  No LMP recorded. Patient is postmenopausal. Menarche: 71 years old Age at first live birth: 71 years old GP: GXP2 LMP: March 1996  Contraceptive: Yes, OCP HRT: No    SOCIAL HISTORY: She is a retired Copywriter, advertising. Her husband Barbara Ferguson is retired from Biomedical engineer. She has two children, Barbara Ferguson and Barbara Ferguson. Barbara Ferguson is a Civil engineer, contracting living in [place].  Barbara Ferguson is a Radio producer in [place].  The patient has 3 grandchildren. She belongs to [employer].  Incidentally the patient is the mother of a close friend of our physical therapist Raeanne Gathers     ADVANCED DIRECTIVES: Yes, her husband Barbara Ferguson is her Press photographer.    HEALTH MAINTENANCE: Social History  Substance Use Topics  . Smoking status: Former Smoker    Quit date: 08/04/1966  . Smokeless tobacco: Never Used  . Alcohol use Yes     Comment: wine 1 glass daily     Colonoscopy: Yes, completed through [place]  PAP:  Bone density: Her prior bone density scan resulted with osteopenia per patient. She is due for another one.    Allergies  Allergen Reactions  . No Known Allergies     Current Outpatient Prescriptions  Medication Sig Dispense Refill  . cholecalciferol (VITAMIN D) 1000 units tablet Take 1,000 Units by mouth daily.    Marland Kitchen glucosamine-chondroitin 500-400 MG tablet Take 1 tablet by mouth 3 (three) times daily.    Marland Kitchen levothyroxine (SYNTHROID, LEVOTHROID) 75 MCG tablet Take 75 mcg by mouth daily before breakfast.    . Multiple Vitamins-Minerals (MULTIVITAMIN & MINERAL PO) Take 1 tablet by mouth daily.     No current facility-administered medications for this visit.     OBJECTIVE: 71-aged white woman who appears well  Vitals:   08/04/17 0901  BP: (!) 143/76  Pulse: (!) 57  Resp: 18  Temp: 97.7 F (36.5 C)  SpO2: 100%     Body mass index is 23.08 kg/m.   Wt  Readings from Last 3 Encounters:  08/04/17 126 lb 3.2 oz (57.2 kg)  06/01/16 119 lb (54 kg)  05/21/16 119 lb 12.8 oz (54.3 kg)      ECOG FS:0 - Asymptomatic  Ocular: Sclerae unicteric, pupils round and equal Ear-nose-throat: Oropharynx clear and moist Lymphatic: No cervical or supraclavicular adenopathy Lungs no rales or rhonchi Heart regular rate and rhythm Abd soft, nontender, positive bowel sounds MSK no focal spinal tenderness, no joint edema Neuro: non-focal, well-oriented, appropriate affect Breasts: The right breast is unremarkable.  The left breast is status post recent biopsy.  There is a small ecchymosis.  There are no skin or nipple changes of concern.  Both axillae are benign.   LAB RESULTS:  CMP     Component Value Date/Time   NA 138 08/04/2017 0846   K 4.3 08/04/2017 0846   CL 104 06/02/2016 0500   CO2 24 08/04/2017 0846   GLUCOSE 85 08/04/2017 0846   BUN 16.9 08/04/2017 0846   CREATININE 1.2 (H) 08/04/2017 0846   CALCIUM 10.1 08/04/2017 0846   PROT 7.2 08/04/2017 0846   ALBUMIN 4.3 08/04/2017 0846   AST 25 08/04/2017 0846   ALT 22 08/04/2017 0846   ALKPHOS 82 08/04/2017 0846   BILITOT  0.82 08/04/2017 0846   GFRNONAA 50 (L) 06/02/2016 0500   GFRAA 58 (L) 06/02/2016 0500    No results found for: TOTALPROTELP, ALBUMINELP, A1GS, A2GS, BETS, BETA2SER, GAMS, MSPIKE, SPEI  No results found for: KPAFRELGTCHN, LAMBDASER, [facility]  Lab Results  Component Value Date   WBC 5.7 08/04/2017   NEUTROABS 4.0 08/04/2017   HGB 13.8 08/04/2017   HCT 41.3 08/04/2017   MCV 90.2 08/04/2017   PLT 262 08/04/2017    '@LASTCHEMISTRY' @  No results found for: LABCA2  No components found for: NIOEVO350  No results for input(s): INR in the last 168 hours.  No results found for: LABCA2  No results found for: KXF818  No results found for: EXH371  No results found for: IRC789  No results found for: CA2729  No components found for: HGQUANT  No results found  for: CEA1 / No results found for: CEA1   No results found for: AFPTUMOR  No results found for: [place]  No results found for: PSA1  Appointment on 08/04/2017  Component Date Value Ref Range Status  . WBC 08/04/2017 5.7  3.9 - 10.3 10e3/uL Final  . NEUT# 08/04/2017 4.0  1.5 - 6.5 10e3/uL Final  . HGB 08/04/2017 13.8  11.6 - 15.9 g/dL Final  . HCT 08/04/2017 41.3  34.8 - 46.6 % Final  . Platelets 08/04/2017 262  145 - 400 10e3/uL Final  . MCV 08/04/2017 90.2  79.5 - 101.0 fL Final  . MCH 08/04/2017 30.3  25.1 - 34.0 pg Final  . MCHC 08/04/2017 33.6  31.5 - 36.0 g/dL Final  . RBC 08/04/2017 4.57  3.70 - 5.45 10e6/uL Final  . RDW 08/04/2017 14.3  11.2 - 14.5 % Final  . lymph# 08/04/2017 1.0  0.9 - 3.3 10e3/uL Final  . MONO# 08/04/2017 0.5  0.1 - 0.9 10e3/uL Final  . Eosinophils Absolute 08/04/2017 0.1  0.0 - 0.5 10e3/uL Final  . Basophils Absolute 08/04/2017 0.0  0.0 - 0.1 10e3/uL Final  . NEUT% 08/04/2017 71.1  38.4 - 76.8 % Final  . LYMPH% 08/04/2017 17.3  14.0 - 49.7 % Final  . MONO% 08/04/2017 9.6  0.0 - 14.0 % Final  . EOS% 08/04/2017 1.1  0.0 - 7.0 % Final  . BASO% 08/04/2017 0.9  0.0 - 2.0 % Final  . Sodium 08/04/2017 138  136 - 145 mEq/L Final  . Potassium 08/04/2017 4.3  3.5 - 5.1 mEq/L Final  . Chloride 08/04/2017 105  98 - 109 mEq/L Final  . CO2 08/04/2017 24  22 - 29 mEq/L Final  . Glucose 08/04/2017 85  70 - 140 mg/dl Final   Glucose reference range is for nonfasting patients. Fasting glucose reference range is 70- 100.  Marland Kitchen BUN 08/04/2017 16.9  7.0 - 26.0 mg/dL Final  . Creatinine 08/04/2017 1.2* 0.6 - 1.1 mg/dL Final  . Total Bilirubin 08/04/2017 0.82  0.20 - 1.20 mg/dL Final  . Alkaline Phosphatase 08/04/2017 82  40 - 150 U/L Final  . AST 08/04/2017 25  5 - 34 U/L Final  . ALT 08/04/2017 22  0 - 55 U/L Final  . Total Protein 08/04/2017 7.2  6.4 - 8.3 g/dL Final  . Albumin 08/04/2017 4.3  3.5 - 5.0 g/dL Final  . Calcium 08/04/2017 10.1  8.4 - 10.4 mg/dL Final    . Anion Gap 08/04/2017 8  3 - 11 mEq/L Final  . EGFR 08/04/2017 46* >60 ml/min/1.73 m2 Final   eGFR is calculated using the CKD-EPI Creatinine Equation (  2009)  . TSH 08/04/2017 0.371  0.308 - 3.960 m(IU)/L Final    (this displays the last labs from the last 3 days)  No results found for: TOTALPROTELP, ALBUMINELP, A1GS, A2GS, BETS, BETA2SER, GAMS, MSPIKE, SPEI (this displays SPEP labs)  No results found for: KPAFRELGTCHN, LAMBDASER, KAPLAMBRATIO (kappa/lambda light chains)  No results found for: HGBA, HGBA2QUANT, HGBFQUANT, HGBSQUAN (Hemoglobinopathy evaluation)   No results found for: LDH  No results found for: IRON, TIBC, IRONPCTSAT (Iron and TIBC)  No results found for: FERRITIN  Urinalysis    Component Value Date/Time   COLORURINE YELLOW 05/21/2016 1055   APPEARANCEUR CLEAR 05/21/2016 1055   LABSPEC 1.017 05/21/2016 1055   PHURINE 5.0 05/21/2016 1055   GLUCOSEU NEGATIVE 05/21/2016 1055   HGBUR NEGATIVE 05/21/2016 1055   BILIRUBINUR NEGATIVE 05/21/2016 1055   [place] 05/21/2016 1055   PROTEINUR NEGATIVE 05/21/2016 1055   NITRITE NEGATIVE 05/21/2016 1055   LEUKOCYTESUR SMALL (A) 05/21/2016 1055     STUDIES: Mm Diag Breast Tomo Bilateral  Result Date: 07/19/2017 CLINICAL DATA:  71 year old female for annual bilateral mammograms and re-evaluate left breast calcifications, for which the patient did not return for follow-up in 2015.Unknown right breast biopsy for which we have no records. EXAM: 2D DIGITAL DIAGNOSTIC BILATERAL MAMMOGRAM WITH CAD AND ADJUNCT TOMO COMPARISON:  12/08/2013, 11/07/2013 and 02/03/2012 mammograms. ACR Breast Density Category c: The breast tissue is heterogeneously dense, which may obscure small masses. FINDINGS: 2D and 3D full field views of both breasts and magnification views of the left breast are performed. A 12 mm group of increasing amorphous and round calcifications are noted within the lower slightly inner left breast with  associated density. No other suspicious mass, distortion or worrisome calcifications noted. Mammographic images were processed with CAD. IMPRESSION: Increasing indeterminate calcifications within the lower slightly inner left breast -tissue sampling is recommended. No mammographic evidence of right breast malignancy. RECOMMENDATION: Stereotactic guided biopsy of left breast calcifications, which will be arranged. I have discussed the findings and recommendations with the patient. Results were also provided in writing at the conclusion of the visit. If applicable, a reminder letter will be sent to the patient regarding the next appointment. BI-RADS CATEGORY  4: Suspicious. Electronically Signed   By: Margarette Canada M.D.   On: 07/19/2017 13:47   Mm Clip Placement Left  Result Date: 07/23/2017 CLINICAL DATA:  Post stereotactic core needle biopsy for left breast calcifications. EXAM: DIAGNOSTIC LEFT MAMMOGRAM POST STEREOTACTIC BIOPSY COMPARISON:  Previous exam(s). FINDINGS: Mammographic images were obtained following stereotactic guided biopsy of left breast calcifications. Two-view mammography demonstrates present coil shaped marker 1.1 cm lateral to the biopsy site in the left lower breast. Post biopsy changes are noted. IMPRESSION: Successful placement of coil shaped marker in the left breast, post stereotactic core needle biopsy. The marker is located 1.1 cm lateral to the biopsy site. Final Assessment: Post Procedure Mammograms for Marker Placement Electronically Signed   By: Fidela Salisbury M.D.   On: 07/23/2017 09:37   Mm Lt Breast Bx W Loc Dev 1st Lesion Image Bx Spec Stereo Guide  Addendum Date: 08/02/2017   ADDENDUM REPORT: 07/26/2017 15:16 ADDENDUM: Pathology revealed GRADE II-III DUCTAL CARCINOMA IN SITU of the Left breast, lower inner quadrant. SUSPICIOUS FOR FOCAL MICROSCOPIC INVASION. This was found to be concordant by Dr. Fidela Salisbury. Pathology results were discussed with the patient by  telephone. The patient reported doing well after the biopsy with tenderness at the site. Post biopsy instructions and care were reviewed and  questions were answered. The patient was encouraged to call [facility] for any additional concerns. The patient was referred to [facility] on August 04, 2017. Pathology results reported by Terie Purser, RN on 07/26/2017. Electronically Signed   By: Fidela Salisbury M.D.   On: 07/26/2017 15:16   Result Date: 08/02/2017 CLINICAL DATA:  Left breast lower slightly inner quadrant calcifications. EXAM: LEFT BREAST STEREOTACTIC CORE NEEDLE BIOPSY COMPARISON:  Previous exams. FINDINGS: The patient and I discussed the procedure of stereotactic-guided biopsy including benefits and alternatives. We discussed the high likelihood of a successful procedure. We discussed the risks of the procedure including infection, bleeding, tissue injury, clip migration, and inadequate sampling. Informed written consent was given. The usual time out protocol was performed immediately prior to the procedure. Using sterile technique and 1% Lidocaine as local anesthetic, under stereotactic guidance, a 9 gauge vacuum assisted device was used to perform core needle biopsy of in the lower inner quadrant of the left breast using a medial approach. Specimen radiograph was performed showing presence of calcifications. Specimens with calcifications are identified for pathology. Lesion quadrant: Lower inner quadrant. At the conclusion of the procedure, a coil shaped tissue marker clip was deployed into the biopsy cavity. Follow-up 2-view mammogram was performed and dictated separately. IMPRESSION: Stereotactic-guided biopsy of left breast. No apparent complications. Electronically Signed: By: Fidela Salisbury M.D. On: 07/23/2017 09:38    ELIGIBLE FOR AVAILABLE RESEARCH PROTOCOL: No  ASSESSMENT: 70  y.o. [place] woman status post left breast lower inner quadrant biopsy July 23, 2017 for ductal carcinoma in situ, grade 2 or 3, with areas suspicious for microinvasion; the tumor being estrogen and progesterone receptor positive  This is 1) breast conserving surgery with sentinel lymph node sampling planned  (2) adjuvant radiation to follow  (3) consider antiestrogens.  PLAN: We spent the better part of today's hour-long appointment discussing the biology of her diagnosis and the specifics of her situation. Alaijah understands that in noninvasive ductal carcinoma, also called ductal carcinoma in situ ("DCIS") the breast cancer cells remain trapped in the ducts were they started. They cannot travel to a vital organ. For that reason these cancers in themselves are not life-threatening.  If the whole breast is removed then all the ducts are removed and since the cancer cells are trapped in the ducts, the cure rate with mastectomy for noninvasive breast cancer is approximately 99%. Nevertheless we recommend lumpectomy, because there is no survival advantage to mastectomy and because the cosmetic result is generally superior with breast conservation.  Since the patient is keeping her breasts, there will be some risk of recurrence. The recurrence can only be in the same breast since, again, the cells are trapped in the ducts. There is no connection from one breast to the other. The risk of local recurrence is cut by more than half with radiation, which is standard in this situation.  In estrogen receptor positive cancers like this 1 anti-estrogens can also be considered. They will further reduce the risk of recurrence by one half. In addition anti-estrogens will lower the risk of a new breast cancer developing in either breast, also by one half. That risk approaches 1% per year. Anti-estrogens reduce it to 1/2%/year.  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 In patients who carry a deleterious mutation  [for example in a  BRCA gene], the risk of a new breast cancer developing in the future may be  sufficiently great that the patient may choose bilateral mastectomies. However if she wishes to keep her breasts in that situation it is safe to do so. That would require intensified screening, which generally means not only yearly mammography but a yearly breast MRI as well.  Faced with this possibility,Gavrielle was not sure whether or not she would want bilateral mass.  For that reason the plan is for her to get genetics testing as soon as possible so she can make her surgical decision with all the relevant information at hand.  Accordingly the overall plan is for genetics testing, followed by surgery, followed by radiation, then a discussion of anti-estrogens.  Today the patient requested additional blood tests including thyroid test and vitamin D tests and these were added.  Kaytelynn has a good understanding of the overall plan. She agrees with it. She knows the goal of treatment in her case is cure. She will call with any problems that may develop before her next visit here.  Adrain Butrick, Virgie Dad, MD  08/04/17 5:52 PM Medical Oncology and Hematology [facility] [address] [place], [place] [address] Tel. [phone]    Fax. [phone]   This document serves as a record of services personally performed by Sullivan Lone, MD. It was created on her behalf by Steva Colder, a trained medical scribe. The creation of this record is based on the scribe's personal observations and the provider's statements to them. This document has been checked and approved by the attending provider.

## 2017-08-04 ENCOUNTER — Ambulatory Visit
Admission: RE | Admit: 2017-08-04 | Discharge: 2017-08-04 | Disposition: A | Discharge status: Home / Self Care | Payer: Medicare Other | Source: Ambulatory Visit | Attending: Radiation Oncology | Admitting: Radiation Oncology

## 2017-08-04 ENCOUNTER — Encounter: Payer: Self-pay | Admitting: Physical Therapy

## 2017-08-04 ENCOUNTER — Ambulatory Visit: Payer: Self-pay | Admitting: Surgery

## 2017-08-04 ENCOUNTER — Encounter: Payer: Self-pay | Admitting: *Deleted

## 2017-08-04 ENCOUNTER — Ambulatory Visit: Payer: Medicare Other | Attending: Surgery | Admitting: Physical Therapy

## 2017-08-04 ENCOUNTER — Encounter: Payer: Self-pay | Admitting: Oncology

## 2017-08-04 ENCOUNTER — Other Ambulatory Visit (HOSPITAL_BASED_OUTPATIENT_CLINIC_OR_DEPARTMENT_OTHER): Payer: Medicare Other

## 2017-08-04 ENCOUNTER — Ambulatory Visit (HOSPITAL_BASED_OUTPATIENT_CLINIC_OR_DEPARTMENT_OTHER): Payer: Medicare Other | Admitting: Oncology

## 2017-08-04 VITALS — BP 143/76 | HR 57 | Temp 97.7°F | Resp 18 | Ht 62.0 in | Wt 126.2 lb

## 2017-08-04 DIAGNOSIS — E039 Hypothyroidism, unspecified: Secondary | ICD-10-CM | POA: Insufficient documentation

## 2017-08-04 DIAGNOSIS — Z510 Encounter for antineoplastic radiation therapy: Secondary | ICD-10-CM | POA: Insufficient documentation

## 2017-08-04 DIAGNOSIS — Z170 Estrogen receptor positive status [ER+]: Principal | ICD-10-CM

## 2017-08-04 DIAGNOSIS — Z79891 Long term (current) use of opiate analgesic: Secondary | ICD-10-CM | POA: Insufficient documentation

## 2017-08-04 DIAGNOSIS — Z9889 Other specified postprocedural states: Secondary | ICD-10-CM | POA: Insufficient documentation

## 2017-08-04 DIAGNOSIS — R293 Abnormal posture: Secondary | ICD-10-CM | POA: Diagnosis present

## 2017-08-04 DIAGNOSIS — Z96653 Presence of artificial knee joint, bilateral: Secondary | ICD-10-CM | POA: Insufficient documentation

## 2017-08-04 DIAGNOSIS — D0512 Intraductal carcinoma in situ of left breast: Secondary | ICD-10-CM

## 2017-08-04 DIAGNOSIS — M25612 Stiffness of left shoulder, not elsewhere classified: Secondary | ICD-10-CM

## 2017-08-04 DIAGNOSIS — Z87891 Personal history of nicotine dependence: Secondary | ICD-10-CM | POA: Insufficient documentation

## 2017-08-04 DIAGNOSIS — Z79899 Other long term (current) drug therapy: Secondary | ICD-10-CM | POA: Insufficient documentation

## 2017-08-04 DIAGNOSIS — E559 Vitamin D deficiency, unspecified: Secondary | ICD-10-CM

## 2017-08-04 DIAGNOSIS — M1990 Unspecified osteoarthritis, unspecified site: Secondary | ICD-10-CM | POA: Insufficient documentation

## 2017-08-04 DIAGNOSIS — C50312 Malignant neoplasm of lower-inner quadrant of left female breast: Secondary | ICD-10-CM | POA: Insufficient documentation

## 2017-08-04 DIAGNOSIS — Z87442 Personal history of urinary calculi: Secondary | ICD-10-CM | POA: Insufficient documentation

## 2017-08-04 DIAGNOSIS — Z803 Family history of malignant neoplasm of breast: Secondary | ICD-10-CM | POA: Insufficient documentation

## 2017-08-04 DIAGNOSIS — R200 Anesthesia of skin: Secondary | ICD-10-CM | POA: Insufficient documentation

## 2017-08-04 DIAGNOSIS — R05 Cough: Secondary | ICD-10-CM | POA: Insufficient documentation

## 2017-08-04 DIAGNOSIS — C50912 Malignant neoplasm of unspecified site of left female breast: Secondary | ICD-10-CM

## 2017-08-04 LAB — CBC WITH DIFFERENTIAL/PLATELET
BASO%: 0.9 % (ref 0.0–2.0)
BASOS ABS: 0 10*3/uL (ref 0.0–0.1)
EOS ABS: 0.1 10*3/uL (ref 0.0–0.5)
EOS%: 1.1 % (ref 0.0–7.0)
HEMATOCRIT: 41.3 % (ref 34.8–46.6)
HEMOGLOBIN: 13.8 g/dL (ref 11.6–15.9)
LYMPH#: 1 10*3/uL (ref 0.9–3.3)
LYMPH%: 17.3 % (ref 14.0–49.7)
MCH: 30.3 pg (ref 25.1–34.0)
MCHC: 33.6 g/dL (ref 31.5–36.0)
MCV: 90.2 fL (ref 79.5–101.0)
MONO#: 0.5 10*3/uL (ref 0.1–0.9)
MONO%: 9.6 % (ref 0.0–14.0)
NEUT#: 4 10*3/uL (ref 1.5–6.5)
NEUT%: 71.1 % (ref 38.4–76.8)
Platelets: 262 10*3/uL (ref 145–400)
RBC: 4.57 10*6/uL (ref 3.70–5.45)
RDW: 14.3 % (ref 11.2–14.5)
WBC: 5.7 10*3/uL (ref 3.9–10.3)

## 2017-08-04 LAB — COMPREHENSIVE METABOLIC PANEL
ALBUMIN: 4.3 g/dL (ref 3.5–5.0)
ALK PHOS: 82 U/L (ref 40–150)
ALT: 22 U/L (ref 0–55)
AST: 25 U/L (ref 5–34)
Anion Gap: 8 mEq/L (ref 3–11)
BUN: 16.9 mg/dL (ref 7.0–26.0)
CALCIUM: 10.1 mg/dL (ref 8.4–10.4)
CHLORIDE: 105 meq/L (ref 98–109)
CO2: 24 mEq/L (ref 22–29)
Creatinine: 1.2 mg/dL — ABNORMAL HIGH (ref 0.6–1.1)
EGFR: 46 mL/min/{1.73_m2} — ABNORMAL LOW (ref >60)
Glucose: 85 mg/dl (ref 70–140)
POTASSIUM: 4.3 meq/L (ref 3.5–5.1)
Sodium: 138 mEq/L (ref 136–145)
Total Bilirubin: 0.82 mg/dL (ref 0.20–1.20)
Total Protein: 7.2 g/dL (ref 6.4–8.3)

## 2017-08-04 LAB — TSH: TSH: 0.371 m(IU)/L (ref 0.308–3.960)

## 2017-08-04 NOTE — Progress Notes (Signed)
Nutrition Assessment  Reason for Assessment:  Pt seen in Breast Clinic  ASSESSMENT:   71 year old female with new diagnosis of breast cancer.  Past medical history reviewed  Patient reports good appetite.  Medications:  reviewed  Labs: reviewed  Anthropometrics:   Height: 62 inches Weight: 126 lb 3.2 oz BMI: 23   NUTRITION DIAGNOSIS: Food and nutrition related knowledge deficit related to new diagnosis of breast cancer as evidenced by no prior need for nutrition related information.  INTERVENTION:   Discussed and provided packet of information regarding nutritional tips for breast cancer patients.  Questions answered.  Teachback method used.  Contact information provided and patient knows to contact me with questions/concerns.    MONITORING, EVALUATION, and GOAL: Pt will consume a healthy plant based diet to maintain lean body mass throughout treatment.   Amayra Kiedrowski B. Zenia Resides, [place], [place] Registered Dietitian [phone] (pager)

## 2017-08-04 NOTE — Progress Notes (Signed)
Clinical Social Work [place] Psychosocial Distress Screening [place]  Patient completed distress screening protocol and scored a 3 on the Psychosocial Distress Thermometer which indicates mild distress. Clinical Social Worker met with patient and patients husband in [facility] to assess for distress and other psychosocial needs. Patient stated she was feeling overwhelmed but felt "better" after meeting with the treatment team and getting more information on her treatment plan. CSW and patient discussed common feeling and emotions when being diagnosed with cancer, and the importance of support during treatment. CSW informed patient of the support team and support services at [facility]. CSW provided contact information and encouraged patient to call with any questions or concerns.  ONCBCN DISTRESS SCREENING 08/04/2017  Screening Type Initial Screening  Distress experienced in past week (1-10) 3  Emotional problem type Adjusting to illness  Physical Problem type Sleep/insomnia;Tingling hands/feet  Physician notified of physical symptoms Yes     Johnnye Lana, MSW, LCSW, OSW-C Clinical Social Worker [facility] [phone]

## 2017-08-04 NOTE — Patient Instructions (Addendum)
External Rotation (Isometric)    Place back of left fist against door frame, with elbow bent. Press fist against door frame. Hold __5__ seconds. Repeat __5__ times. Do __2__ sessions per day.  http://gt2.exer.us/110   Copyright  VHI. All rights reserved.   Physical Therapy Information for After Breast Cancer Surgery/Treatment:   Lymphedema is a swelling condition that you may be at risk for in your arm if you have lymph nodes removed from the armpit area.  After a sentinel node biopsy, the risk is approximately 5-9% and is higher after an axillary node dissection.  There is treatment available for this condition and it is not life-threatening.  Contact your physician or physical therapist with concerns.  You may begin the 4 shoulder/posture exercises (see additional sheet) when permitted by your physician (typically a week after surgery).  If you have drains, you may need to wait until those are removed before beginning range of motion exercises.  A general recommendation is to not lift your arms above shoulder height until drains are removed.  These exercises should be done to your tolerance and gently.  This is not a "no pain/no gain" type of recovery so listen to your body and stretch into the range of motion that you can tolerate, stopping if you have pain.  If you are having immediate reconstruction, ask your plastic surgeon about doing exercises as he or she may want you to wait.  We encourage you to attend the free one time ABC (After Breast Cancer) class offered by [facility].  You will learn information related to lymphedema risk, prevention and treatment and additional exercises to regain mobility following surgery.  You can call [phone] for more information.  This is offered the 1st and 3rd Monday of each month.  You only attend the class one time.  While undergoing any medical procedure or treatment, try to avoid blood pressure being taken or needle  sticks from occurring on the arm on the side of cancer.   This recommendation begins after surgery and continues for the rest of your life.  This may help reduce your risk of getting lymphedema (swelling in your arm).  An excellent resource for those seeking information on lymphedema is the National Lymphedema Network's web site. It can be accessed at [place].org  If you notice swelling in your hand, arm or breast at any time following surgery (even if it is many years from now), please contact your doctor or physical therapist to discuss this.  Lymphedema can be treated at any time but it is easier for you if it is treated early on.  If you feel like your shoulder motion is not returning to normal in a reasonable amount of time, please contact your surgeon or physical therapist.  Barbara Ferguson. [place], [place], [place] [phone]; [address] [place], [place] [address] ABC CLASS After Breast Cancer Class  After Breast Cancer Class is a specially designed exercise class to assist you in a safe recover after having breast cancer surgery.  In this class you will learn how to get back to full function whether your drains were just removed or if you had surgery a month ago.  This one-time class is held the 1st and 3rd Monday of every month from 11:00 a.m. until 12:00 noon at the [facility] located at [place], [place] [address]  This class is FREE and space is limited. For more information or to register for the  next available class, call [phone].  Class Goals   Understand specific stretches to improve the flexibility of you chest and shoulder.  Learn ways to safely strengthen your upper body and improve your posture.  Understand the warning signs of infection and why you may be at risk for an arm infection.  Learn about Lymphedema and prevention.  ** You do not attend this class until after surgery.  Drains must be removed to  participate  Patient was instructed today in a home exercise program today for post op shoulder range of motion. These included active assist shoulder flexion in sitting, scapular retraction, wall walking with shoulder abduction, and hands behind head external rotation.  She was encouraged to do these twice a day, holding 3 seconds and repeating 5 times when permitted by her physician.

## 2017-08-04 NOTE — Progress Notes (Addendum)
Radiation Oncology         ([phone]) [phone] ________________________________  Initial outpatient Consultation  Name: Barbara Ferguson MRN: [id number]  Date: 08/04/2017  DOB: November 02, 1945  EY:[id number], Ronie Spies, MD  Alphonsa Overall, MD   REFERRING PHYSICIAN: Alphonsa Overall, MD  DIAGNOSIS:    ICD-10-CM   1. 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 female who presented with a palpable breast mass. A mammogram was completed on 07/19/17 showing increasing indeterminate calcifications within the lower slightly inner left breast. There was no mammographic evidence of right breast malignancy.  Biopsy on 07/23/17 showed DCIS, Grade 2 to 3 suspicious for microscopic invasion, ER positive, PR positive.  Of note, the patient had a screening mammogram in January 2015 that warranted further evaluation but patient did not undergo recommended diagnostic mammogram. On 12/08/2013, she had a unilateral left mammography at [facility] with results showing likely benign calcifications in the inner left breast, middle 1/3. At this visit, it was recommended that the patient follow up in 6 months for a diagnostic left mammogram with spot magnifications but this was never completed.  Patient endorses history of smoking for one year in 1967.  The patient's first menstrual period was at 71 years old. She denies periods currently with the last occurring in 1996. Patient denies hx of hormone replacement. She has carried 2 children to term with the first live birth at 71 y/o. She is not currently pregnant or trying to become pregnant. Patient endorses history of birth control pulls or hormone shot for contraception.   On review of  systems, patient endorses wearing glasses and/or contacts. She also endorses productive coughing and kidney disease.   She endorses history of abnormal moles. Patient endorses numbness at right hand. She endorses hypothyroidism and levothyroxine for treatment.   PREVIOUS RADIATION THERAPY: reports an iodine "beam" treatment to her thyroid region as a teenager.  PAST MEDICAL HISTORY:  has a past medical history of Arthritis; Ductal carcinoma in situ (DCIS) of left breast (07/28/2017); History of colon polyps; History of kidney stones; Hypothyroidism; Joint pain; Joint swelling; and PONV (postoperative nausea and vomiting).    PAST SURGICAL HISTORY: Past Surgical History:  Procedure Laterality Date  . COLONOSCOPY WITH PROPOFOL N/A 09/04/2014   Procedure: COLONOSCOPY WITH PROPOFOL;  Surgeon: Garlan Fair, MD;  Location: [facility];  Service: Endoscopy;  Laterality: N/A;  . DILATION AND CURETTAGE OF UTERUS    . 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 TONSILLECTOMY    . TOTAL KNEE ARTHROPLASTY Left 01/20/2016   Procedure: TOTAL KNEE ARTHROPLASTY;  Surgeon: Vickey Huger, MD;  Location: [facility];  Service: Orthopedics;  Laterality: Left;  . TOTAL KNEE ARTHROPLASTY Right 06/01/2016   Procedure: TOTAL KNEE ARTHROPLASTY;  Surgeon: Vickey Huger, MD;  Location: [facility];  Service: Orthopedics;  Laterality: Right;    FAMILY HISTORY: family history includes Breast cancer in her sister.  SOCIAL HISTORY:  reports that she quit smoking about 51 years ago. She has never used smokeless tobacco. She reports that she drinks alcohol. She reports that she does not use drugs.  ALLERGIES: No known allergies  MEDICATIONS:  Current Outpatient Prescriptions  Medication Sig Dispense Refill  . cholecalciferol (  VITAMIN D) 1000 units tablet Take 1,000 Units by mouth daily.    Marland Kitchen glucosamine-chondroitin 500-400 MG tablet Take 1 tablet by mouth 3 (three) times daily.    Marland Kitchen levothyroxine (SYNTHROID, LEVOTHROID) 75 MCG tablet Take 75  mcg by mouth daily before breakfast.    . Multiple Vitamins-Minerals (MULTIVITAMIN & MINERAL PO) Take 1 tablet by mouth daily.     No current facility-administered medications for this encounter.     REVIEW OF SYSTEMS: A 10+ POINT REVIEW OF SYSTEMS WAS OBTAINED including neurology, dermatology, psychiatry, cardiac, respiratory, lymph, extremities, GI, GU, Musculoskeletal, constitutional, breasts, reproductive, HEENT.  All pertinent positives are noted in the HPI.  All others are negative.   PHYSICAL EXAM:   General: Alert and oriented, in no acute distress HEENT: Head is normocephalic. Extraocular movements are intact. Oropharynx is clear. Neck: Neck is supple, no palpable cervical or supraclavicular lymphadenopathy. Heart: Regular in rate and rhythm with no murmurs, rubs, or gallops. Chest: Clear to auscultation bilaterally, with no rhonchi, wheezes, or rales. Abdomen: Soft, nontender, nondistended, with no rigidity or guarding. Extremities: No cyanosis or edema. Lymphatics: see Neck Exam Skin: No concerning lesions. Musculoskeletal: symmetric strength and muscle tone throughout. Neurologic: Cranial nerves II through XII are grossly intact. No obvious focalities. Speech is fluent. Coordination is intact. Psychiatric: Judgment and insight are intact. Affect is appropriate. Breasts: she has a mass that is a little larger than a cm in the lower inner quadrant of the left breast. Otherwise axilla and breasts have no other palpable masses.     ECOG = 0  0 - Asymptomatic (Fully active, able to carry on all predisease activities without restriction)  1 - Symptomatic but completely ambulatory (Restricted in physically strenuous activity but ambulatory and able to carry out work of a light or sedentary nature. For example, light housework, office work)  2 - Symptomatic, <50% in bed during the day (Ambulatory and capable of all self care but unable to carry out any work activities. Up and  about more than 50% of waking hours)  3 - Symptomatic, >50% in bed, but not bedbound (Capable of only limited self-care, confined to bed or chair 50% or more of waking hours)  4 - Bedbound (Completely disabled. Cannot carry on any self-care. Totally confined to bed or chair)  5 - Death   Eustace Pen MM, Creech RH, Tormey DC, et al. [phone]). "Toxicity and response criteria of the [facility]". [place] Oncol. 5 (6): 649-55   LABORATORY DATA:  Lab Results  Component Value Date   WBC 5.7 08/04/2017   HGB 13.8 08/04/2017   HCT 41.3 08/04/2017   MCV 90.2 08/04/2017   PLT 262 08/04/2017   CMP     Component Value Date/Time   NA 138 08/04/2017 0846   K 4.3 08/04/2017 0846   CL 104 06/02/2016 0500   CO2 24 08/04/2017 0846   GLUCOSE 85 08/04/2017 0846   BUN 16.9 08/04/2017 0846   CREATININE 1.2 (H) 08/04/2017 0846   CALCIUM 10.1 08/04/2017 0846   PROT 7.2 08/04/2017 0846   ALBUMIN 4.3 08/04/2017 0846   AST 25 08/04/2017 0846   ALT 22 08/04/2017 0846   ALKPHOS 82 08/04/2017 0846   BILITOT 0.82 08/04/2017 0846   GFRNONAA 50 (L) 06/02/2016 0500   GFRAA 58 (L) 06/02/2016 0500         RADIOGRAPHY: Mm Diag Breast Tomo Bilateral  Result Date: 07/19/2017 CLINICAL DATA:  71 year old female for annual bilateral mammograms and  re-evaluate left breast calcifications, for which the patient did not return for follow-up in 2015.Unknown right breast biopsy for which we have no records. EXAM: 2D DIGITAL DIAGNOSTIC BILATERAL MAMMOGRAM WITH CAD AND ADJUNCT TOMO COMPARISON:  12/08/2013, 11/07/2013 and 02/03/2012 mammograms. ACR Breast Density Category c: The breast tissue is heterogeneously dense, which may obscure small masses. FINDINGS: 2D and 3D full field views of both breasts and magnification views of the left breast are performed. A 12 mm group of increasing amorphous and round calcifications are noted within the lower slightly inner left breast with associated density. No  other suspicious mass, distortion or worrisome calcifications noted. Mammographic images were processed with CAD. IMPRESSION: Increasing indeterminate calcifications within the lower slightly inner left breast -tissue sampling is recommended. No mammographic evidence of right breast malignancy. RECOMMENDATION: Stereotactic guided biopsy of left breast calcifications, which will be arranged. I have discussed the findings and recommendations with the patient. Results were also provided in writing at the conclusion of the visit. If applicable, a reminder letter will be sent to the patient regarding the next appointment. BI-RADS CATEGORY  4: Suspicious. Electronically Signed   By: Margarette Canada M.D.   On: 07/19/2017 13:47   Mm Clip Placement Left  Result Date: 07/23/2017 CLINICAL DATA:  Post stereotactic core needle biopsy for left breast calcifications. EXAM: DIAGNOSTIC LEFT MAMMOGRAM POST STEREOTACTIC BIOPSY COMPARISON:  Previous exam(s). FINDINGS: Mammographic images were obtained following stereotactic guided biopsy of left breast calcifications. Two-view mammography demonstrates present coil shaped marker 1.1 cm lateral to the biopsy site in the left lower breast. Post biopsy changes are noted. IMPRESSION: Successful placement of coil shaped marker in the left breast, post stereotactic core needle biopsy. The marker is located 1.1 cm lateral to the biopsy site. Final Assessment: Post Procedure Mammograms for Marker Placement Electronically Signed   By: Fidela Salisbury M.D.   On: 07/23/2017 09:37   Mm Lt Breast Bx W Loc Dev 1st Lesion Image Bx Spec Stereo Guide  Addendum Date: 08/02/2017   ADDENDUM REPORT: 07/26/2017 15:16 ADDENDUM: Pathology revealed GRADE II-III DUCTAL CARCINOMA IN SITU of the Left breast, lower inner quadrant. SUSPICIOUS FOR FOCAL MICROSCOPIC INVASION. This was found to be concordant by Dr. Fidela Salisbury. Pathology results were discussed with the patient by telephone. The patient  reported doing well after the biopsy with tenderness at the site. Post biopsy instructions and care were reviewed and questions were answered. The patient was encouraged to call [facility] for any additional concerns. The patient was referred to [facility] on August 04, 2017. Pathology results reported by Terie Purser, RN on 07/26/2017. Electronically Signed   By: Fidela Salisbury M.D.   On: 07/26/2017 15:16   Result Date: 08/02/2017 CLINICAL DATA:  Left breast lower slightly inner quadrant calcifications. EXAM: LEFT BREAST STEREOTACTIC CORE NEEDLE BIOPSY COMPARISON:  Previous exams. FINDINGS: The patient and I discussed the procedure of stereotactic-guided biopsy including benefits and alternatives. We discussed the high likelihood of a successful procedure. We discussed the risks of the procedure including infection, bleeding, tissue injury, clip migration, and inadequate sampling. Informed written consent was given. The usual time out protocol was performed immediately prior to the procedure. Using sterile technique and 1% Lidocaine as local anesthetic, under stereotactic guidance, a 9 gauge vacuum assisted device was used to perform core needle biopsy of in the lower inner quadrant of the left breast using a medial approach. Specimen radiograph  was performed showing presence of calcifications. Specimens with calcifications are identified for pathology. Lesion quadrant: Lower inner quadrant. At the conclusion of the procedure, a coil shaped tissue marker clip was deployed into the biopsy cavity. Follow-up 2-view mammogram was performed and dictated separately. IMPRESSION: Stereotactic-guided biopsy of left breast. No apparent complications. Electronically Signed: By: Fidela Salisbury M.D. On: 07/23/2017 09:38      IMPRESSION/PLAN:  Left breast cancer  She has been discussed at our  multidisciplinary tumor board.  The consensus is that she would be a good candidate for breast conservation. I talked to her about the option of a mastectomy and informed her that her expected overall survival would be equivalent between mastectomy and breast conservation, based upon randomized controlled data. She is enthusiastic about breast conservation.   It was a pleasure meeting the patient today. We discussed the risks, benefits, and side effects of radiotherapy. I recommend radiotherapy to the left breast to reduce her risk of locoregional recurrence by 2/3.  We discussed that radiation would take approximately 4 weeks to complete and that I would give the patient a few weeks to heal following surgery before starting treatment planning. We spoke about acute effects including skin irritation and fatigue as well as much less common late effects including internal organ injury or irritation. We spoke about the latest technology that is used to minimize the risk of late effects for patients undergoing radiotherapy to the breast or chest wall. No guarantees of treatment were given. The patient is enthusiastic about proceeding with treatment. I look forward to participating in the patient's care.  I will await her referral back to me for postoperative follow-up and eventual CT simulation/treatment planning.  _______   Eppie Gibson, MD   This document serves as a record of services personally performed by Eppie Gibson, MD. It was created on his behalf by Linward Natal, a trained medical scribe. The creation of this record is based on the scribe's personal observations and the provider's statements to them. This document has been checked and approved by the attending provider.

## 2017-08-04 NOTE — Addendum Note (Signed)
Encounter addended by: Eppie Gibson, MD on: 08/04/2017  2:53 PM<BR>    Actions taken: Sign clinical note

## 2017-08-04 NOTE — Therapy (Signed)
[place] [employer], [place], [address] Phone: [phone]   Fax:  [phone]  Physical Therapy Evaluation  Patient Details  Name: Barbara Ferguson MRN: [id number] Date of Birth: 10-08-1945 Referring Provider: Dr. Alphonsa Overall  Encounter Date: 08/04/2017      PT End of Session - 08/04/17 1233    Visit Number 1   Number of Visits 2   Date for PT Re-Evaluation 10/04/17   PT Start Time 8242   PT Stop Time 1200   PT Time Calculation (min) 32 min   Activity Tolerance Patient tolerated treatment well   Behavior During Therapy [facility] for tasks assessed/performed      Past Medical History:  Diagnosis Date  . Arthritis   . 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 History of colon polyps    benign  . History of kidney stones   . Hypothyroidism    takes SYnthroid daily  . Joint pain   . Joint swelling   . PONV (postoperative nausea and vomiting)     Past Surgical History:  Procedure Laterality Date  . COLONOSCOPY WITH PROPOFOL N/A 09/04/2014   Procedure: COLONOSCOPY WITH PROPOFOL;  Surgeon: Garlan Fair, MD;  Location[facility];  Service: Endoscopy;  Laterality: N/A;  . DILATION AND CURETTAGE OF UTERUS    . 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 TONSILLECTOMY    . TOTAL KNEE ARTHROPLASTY Left 01/20/2016   Procedure: TOTAL KNEE ARTHROPLASTY;  Surgeon: Vickey Huger, MD;  Location: [place];  Service: Orthopedics;  Laterality: Left;  . TOTAL KNEE ARTHROPLASTY Right 06/01/2016   Procedure: TOTAL KNEE ARTHROPLASTY;  Surgeon: Vickey Huger, MD;  Location: [place];  Service: Orthopedics;  Laterality: Right;    There were no vitals filed for this visit.       Subjective Assessment - 08/04/17 1234    Subjective Patient reports she is here to be seen by her multidisciplinary medical team for her newly diagnosed left breast cancer.   Patient is accompained by: Family member   Pertinent History Patient was  diagnosed on 07/19/17 with left grade 2 invasive ductal carcinoma breast cancer. It measures 1.2 cm and is ER/PR positive. There is suspicion for invasive cancer.   Patient Stated Goals Reduce lymphedema risk and learn post op shoulder ROM HEP            [facility] PT Assessment - 08/04/17 0001      Assessment   Medical Diagnosis Left breast cancer   Referring Provider Dr. Alphonsa Overall   Onset Date/Surgical Date 07/19/17   Hand Dominance Right   Prior Therapy none     Precautions   Precautions Other (comment)   Precaution Comments active cancer     Restrictions   Weight Bearing Restrictions No     Balance Screen   Has the patient fallen in the past 6 months No   Has the patient had a decrease in activity level because of a fear of falling?  No   Is the patient reluctant to leave their home because of a fear of falling?  No     Home Environment   Living Environment Private residence   Living Arrangements Spouse/significant other   Available Help at Discharge Family     Prior Function   Level of Independence Independent   Vocation Retired   Biomedical scientist retired Copywriter, advertising   Leisure She does yoga 5x/week, tennis 1x/week, and [employer]  Overall Cognitive Status Within Functional Limits for tasks assessed     Posture/Postural Control   Posture/Postural Control Postural limitations   Postural Limitations Rounded Shoulders;Forward head     ROM / Strength   AROM / PROM / Strength AROM;Strength     AROM   AROM Assessment Site Shoulder;Cervical   Right/Left Shoulder Right;Left   Right Shoulder Extension 61 Degrees   Right Shoulder Flexion 169 Degrees   Right Shoulder ABduction 180 Degrees   Right Shoulder Internal Rotation 73 Degrees   Right Shoulder External Rotation 40 Degrees   Left Shoulder Extension 62 Degrees   Left Shoulder Flexion 173 Degrees   Left Shoulder ABduction 175 Degrees   Left Shoulder Internal Rotation 76 Degrees   Left  Shoulder External Rotation 41 Degrees  Very limited actively in sitting but full PROM supine   Cervical Flexion WNL   Cervical Extension WNL   Cervical - Right Side Bend WNL   Cervical - Left Side Bend WNL   Cervical - Right Rotation WNL   Cervical - Left Rotation WNL     Strength   Strength Assessment Site Shoulder   Right/Left Shoulder Right;Left   Right Shoulder Flexion 5/5   Right Shoulder Extension 5/5   Right Shoulder ABduction 5/5   Right Shoulder Internal Rotation 5/5   Right Shoulder External Rotation 3/5   Left Shoulder Flexion 5/5   Left Shoulder Extension 5/5   Left Shoulder ABduction 5/5   Left Shoulder Internal Rotation 5/5   Left Shoulder External Rotation 3-/5           LYMPHEDEMA/ONCOLOGY QUESTIONNAIRE - 08/04/17 1239      Type   Cancer Type Left breast cancer     Lymphedema Assessments   Lymphedema Assessments Upper extremities     Right Upper Extremity Lymphedema   10 cm Proximal to Olecranon Process 26.8 cm   Olecranon Process 22.2 cm   10 cm Proximal to Ulnar Styloid Process 19.7 cm   Just Proximal to Ulnar Styloid Process 13.9 cm   Across Hand at [employer] 17.4 cm   At [place] of 2nd Digit 5.9 cm     Left Upper Extremity Lymphedema   10 cm Proximal to Olecranon Process 25.8 cm   Olecranon Process 21.9 cm   10 cm Proximal to Ulnar Styloid Process 19 cm   Just Proximal to Ulnar Styloid Process 13.7 cm   Across Hand at [employer] 16.9 cm   At [place] of 2nd Digit 5.8 cm         Objective measurements completed on examination: See above findings.          Patient was instructed today in a home exercise program today for post op shoulder range of motion. These included active assist shoulder flexion in sitting, scapular retraction, wall walking with shoulder abduction, and hands behind head external rotation.  She was encouraged to do these twice a day, holding 3 seconds and repeating 5 times when permitted by her  physician.            PT Education - 08/04/17 1154    Education provided Yes   Education Details Lymphedema risk reduction and post op shoulder ROM HEP; shoulder ER isometrics   Person(s) Educated Patient;Spouse   Methods Explanation;Demonstration;Handout   Comprehension Returned demonstration;Verbalized understanding              Breast Clinic Goals - 08/04/17 1249      Patient will be able to  verbalize understanding of pertinent lymphedema risk reduction practices relevant to her diagnosis specifically related to skin care.   Time 1   Period Days   Status Achieved     Patient will be able to return demonstrate and/or verbalize understanding of the post-op home exercise program related to regaining shoulder range of motion.   Time 1   Period Days   Status Achieved     Patient will be able to verbalize understanding of the importance of attending the postoperative After Breast Cancer Class for further lymphedema risk reduction education and therapeutic exercise.   Time 1   Period Days   Status Achieved          Long Term Clinic Goals - 08/04/17 1249      CC Long Term Goal  #1   Title Patient will have returned to baseline with shoulder function and ROM.   Time 8   Period Weeks   Status New             Plan - 08/04/17 1234    Clinical Impression Statement Patient was diagnosed on 07/19/17 with left grade 2 invasive ductal carcinoma breast cancer. It measures 1.2 cm and is ER/PR positive. There is suspicion for invasive cancer. Her multidisciplinary medical team met prior to her assessments to determine a recommended treatment plan. She is planning to have genetic testing, a left lumpectomy with a sentinel node biopsy followed by radiation and anti-estrogen therapy. She will benefit from a post op PT visit to reassess and determine needs.   History and Personal Factors relevant to plan of care: Has very limited active shoulder external rotation   Clinical  Presentation Stable   Clinical Decision Making Low   Clinical Impairments Affecting Rehab Potential none   PT Frequency --  Eval and 1 f/u visit   PT Treatment/Interventions ADLs/Self Care Home Management;Patient/family education;Therapeutic exercise   PT Next Visit Plan Will reassess after surgery to determine PT needs   PT Home Exercise Plan Post op shoulder ROM HEP   Consulted and Agree with Plan of Care Patient;Family member/caregiver   Family Member Consulted Husband      Patient will benefit from skilled therapeutic intervention in order to improve the following deficits and impairments:  Decreased range of motion, Impaired UE functional use, Pain, Decreased knowledge of precautions, Postural dysfunction  Visit Diagnosis: Ductal carcinoma in situ (DCIS) of left breast - Plan: PT plan of care cert/re-cert  Abnormal posture - Plan: PT plan of care cert/re-cert  Stiffness of left shoulder, not elsewhere classified - Plan: PT plan of care cert/re-cert      G-Codes - 03/52/48 1250    Functional Assessment Tool Used (Outpatient Only) Clinical Judgement   Functional Limitation Other PT primary   Other PT Primary Current Status (L8590) At least 1 percent but less than 20 percent impaired, limited or restricted   Other PT Primary Goal Status (B3112) At least 1 percent but less than 20 percent impaired, limited or restricted     Patient will follow up at outpatient cancer rehab 3-4 weeks following surgery.  If the patient requires physical therapy at that time, a specific plan will be dictated and sent to the referring physician for approval. The patient was educated today on appropriate basic range of motion exercises to begin post operatively and the importance of attending the After Breast Cancer class following surgery.  Patient was educated today on lymphedema risk reduction practices as it pertains to recommendations that will benefit  the patient immediately following surgery.  She  verbalized good understanding.     Problem List Patient Active Problem List   Diagnosis Date Noted  . Hypothyroidism 08/04/2017  . Vitamin D deficiency 08/04/2017  . 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

## 2017-08-05 ENCOUNTER — Ambulatory Visit (HOSPITAL_BASED_OUTPATIENT_CLINIC_OR_DEPARTMENT_OTHER): Payer: Medicare Other | Admitting: Genetics

## 2017-08-05 ENCOUNTER — Telehealth: Payer: Self-pay | Admitting: Oncology

## 2017-08-05 ENCOUNTER — Encounter: Payer: Self-pay | Admitting: Genetics

## 2017-08-05 ENCOUNTER — Other Ambulatory Visit: Payer: Self-pay | Admitting: Oncology

## 2017-08-05 ENCOUNTER — Encounter: Payer: Self-pay | Admitting: Oncology

## 2017-08-05 DIAGNOSIS — C50312 Malignant neoplasm of lower-inner quadrant of left female breast: Secondary | ICD-10-CM

## 2017-08-05 DIAGNOSIS — Z7183 Encounter for nonprocreative genetic counseling: Secondary | ICD-10-CM

## 2017-08-05 DIAGNOSIS — Z170 Estrogen receptor positive status [ER+]: Secondary | ICD-10-CM

## 2017-08-05 DIAGNOSIS — Z800 Family history of malignant neoplasm of digestive organs: Secondary | ICD-10-CM

## 2017-08-05 DIAGNOSIS — Z803 Family history of malignant neoplasm of breast: Secondary | ICD-10-CM | POA: Diagnosis not present

## 2017-08-05 DIAGNOSIS — D0512 Intraductal carcinoma in situ of left breast: Secondary | ICD-10-CM

## 2017-08-05 LAB — T3 UPTAKE
FREE THYROXINE INDEX: 2.5 (ref 1.2–4.9)
T3 UPTAKE RATIO: 29 % (ref 24–39)

## 2017-08-05 LAB — T4, FREE: FREE T4: 1.83 ng/dL — AB (ref 0.82–1.77)

## 2017-08-05 LAB — T4: T4 TOTAL: 8.5 ug/dL (ref 4.5–12.0)

## 2017-08-05 LAB — VITAMIN D 25 HYDROXY (VIT D DEFICIENCY, FRACTURES): VIT D 25 HYDROXY: 42.1 ng/mL (ref 30.0–100.0)

## 2017-08-05 NOTE — Telephone Encounter (Signed)
Scheduled appt per 10/31 los - sent reminder letter in the mail.

## 2017-08-05 NOTE — Progress Notes (Signed)
REFERRING PROVIDER: Chauncey Cruel, MD [address] [place], [place] [address]  PRIMARY PROVIDER:  Leeroy Cha, MD  PRIMARY REASON FOR VISIT:  1. Ductal carcinoma in situ (DCIS) of left breast   2. Family history of breast cancer   3. Family history of colon cancer   4. Malignant neoplasm of lower-inner quadrant of left breast in female, estrogen receptor positive ([place])     HISTORY OF PRESENT ILLNESS:   Barbara Ferguson, a 71 y.o. female, was seen for a [facility] cancer genetics consultation at the request of Dr. Jana Hakim due to a personal and family history of cancer.  Barbara Ferguson presents to clinic today to discuss the possibility of a hereditary predisposition to cancer, genetic testing, and to further clarify her future cancer risks, as well as potential cancer risks for family members.   In October 2018, at the age of 79, Barbara Ferguson was diagnosed with Ductal Carcinoma In Situ of the left breast. She is considering surgical options, but is currently planing to have breast conserving surgery + sentinel node sampling with adjuvant radiation and consideration of adjuvant antiestrogen therapy   HORMONAL RISK FACTORS:  Menarche was at age 44.  First live birth at age 28.  years.  Ovaries intact: yes.  Hysterectomy: no.  Menopausal status: postmenopausal.  HRT use: 0 years. Mammogram within the last year: yes.  Past Medical History:  Diagnosis Date  . Arthritis   . 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 Family history of breast cancer   . Family history of colon cancer   . History of colon polyps    benign  . History of kidney stones   . Hypothyroidism    takes SYnthroid daily  . Joint pain   . Joint swelling   . PONV (postoperative nausea and vomiting)     Past Surgical History:  Procedure Laterality Date  . COLONOSCOPY WITH PROPOFOL N/A 09/04/2014   Procedure: COLONOSCOPY WITH PROPOFOL;  Surgeon: Garlan Fair, MD;  [facility];  Service: Endoscopy;  Laterality: N/A;  . DILATION AND CURETTAGE OF UTERUS    . 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 TONSILLECTOMY    . TOTAL KNEE ARTHROPLASTY Left 01/20/2016   Procedure: TOTAL KNEE ARTHROPLASTY;  Surgeon: Vickey Huger, MD;  Location: [place];  Service: Orthopedics;  Laterality: Left;  . TOTAL KNEE ARTHROPLASTY Right 06/01/2016   Procedure: TOTAL KNEE ARTHROPLASTY;  Surgeon: Vickey Huger, MD;  Location: [place];  Service: Orthopedics;  Laterality: Right;    Social History   Social History  . Marital status: Married    Spouse name: N/A  . Number of children: N/A  . Years of education: N/A   Social History Main Topics  . Smoking status: Former Smoker    Quit date: 08/04/1966  . Smokeless tobacco: Never Used  . Alcohol use Yes     Comment: wine 1 glass daily  . Drug use: No  . Sexual activity: Not Asked   Other Topics Concern  . None   Social History Narrative  . None     FAMILY HISTORY:  We obtained a detailed, 4-generation family history.  Significant diagnoses are listed below: Family History  Problem Relation Age of Onset  . Breast cancer Sister 82       had Genetic testing in ~2016, reportedly negative  . Stroke Mother   . Colon cancer Maternal Grandmother 41  . Heart disease Maternal Grandfather    Ms.  Ferguson has a 85 year-old son and a 6 year-old daughter who have no history of cancer.  Barbara Ferguson daughter has 2 children with no history of cancer.  Barbara Ferguson's sister was diagnosed with breast cancer at 48.  She reportedly had genetic testing for cancer risk about 2 years ago (2016) that was negative.  The test report was unavailable for review. This sister has her uterus and ovaries still intact, and has 2 children with no history of cancer. Barbara Ferguson also has a maternal half-brother who is 16 with no history of cancer or any children.    Barbara Ferguson's father died at 44 with no history of cancer.  Barbara Ferguson has 2 paternal aunts who died at ages greater  than 31 years with no known history of cancer.  Barbara Ferguson has several paternal cousins, none with any known history of cancer. Barbara Ferguson knows very little about her paternal grandparents.  She reports her died in his 32's, the cause is unknown, and her paternal grandmother died in her 11's.  Barbara Ferguson's mother died at age 87 due to a stroke.  She had a hysterectomy in her 56's.  Barbara Ferguson has 2 maternal uncles and 1 maternal aunt who all died in their 21's and 95's with no history of cancer.  Barbara Ferguson has several maternal cousins, none with any known history of cancer.  Barbara Ferguson's maternal grandfather died in his 76's due to heart disease and her maternal grandmother was diagnosed with colon cancer in her 34's.  She died in her 46's due to Alzheimer's disease.  Barbara Ferguson is not aware of any other cancers on her maternal grandmother's side of the family.    Patient's maternal ancestors are of Native American/Northern European descent, and paternal ancestors are of [place] descent. There is no reported Ashkenazi Jewish ancestry. There is no known consanguinity.  GENETIC COUNSELING ASSESSMENT: Barbara Ferguson is a 71 y.o. female with a personal and family history which is somewhat suggestive of a Hereditary Cancer Predisposition Syndrome. We, therefore, discussed and recommended the following at today's visit.   DISCUSSION: We reviewed the characteristics, features and inheritance patterns of hereditary cancer syndromes. We also discussed genetic testing, including the appropriate family members to test, the process of testing, insurance coverage and turn-around-time for results. We discussed the implications of a negative, positive and/or variant of uncertain significant result. In order to get genetic test results in a timely manner so that Barbara Ferguson can use these genetic test results for surgical decisions, we offered Ms. Renshaw the option to pursue genetic testing for the Breast Cancer STAT Panel.  The STAT Breast  cancer panel offered by [facility] includes sequencing and rearrangement analysis for the following 9 genes:  ATM, BRCA1, BRCA2, CDH1, CHEK2, PALB2, PTEN, STK11 and TP53.    We can then Ms. Savary pursue reflex genetic testing to the Common Hereditary Cancers gene panel. The Common Hereditary Cancer Gene Panel offered by [facility] includes sequencing and/or deletion duplication testing of the following 47 genes: APC, ATM, AXIN2, BARD1, BMPR1A, BRCA1, BRCA2, BRIP1, CDH1, CDKN2A (p14ARF), CDKN2A (p16INK4a), CKD4, CHEK2, CTNNA1, DICER1, EPCAM (Deletion/duplication testing only), GREM1 (promoter region deletion/duplication testing only), KIT, MEN1, MLH1, MSH2, MSH3, MSH6, MUTYH, NBN, NF1, NHTL1, PALB2, PDGFRA, PMS2, POLD1, POLE, PTEN, RAD50, RAD51C, RAD51D, SDHB, SDHC, SDHD, SMAD4, SMARCA4. STK11, TP53, TSC1, TSC2, and VHL.  The following genes were evaluated for sequence changes only: SDHA and HOXB13 c.251G>A variant only.  We discussed that only 5-10% of  cancers are associated with a Hereditary cancer predisposition syndrome.  One of the most common hereditary cancer syndromes that increases breast cancer risk is called Hereditary Breast and Ovarian Cancer (HBOC) syndrome.  This syndrome is caused by mutations in the BRCA1 and BRCA2 genes.  This syndrome increases an individual's lifetime risk to develop breast, ovarian, pancreatic, and other types of cancer.  There are also many other cancer predisposition syndromes caused by mutations in several other genes.  We discussed that if she is found to have a mutation in one of these genes, it may impact surgical decisions, and alter future medical management recommendations such as increased cancer screenings and consideration of risk reducing surgeries.  A positive result could also have implications for the patient's family members.  A Negative result would mean we were unable to identify a hereditary component to her cancer, but does not rule out the possibility of a  hereditary basis for her cancer.  There could be mutations that are undetectable by current technology, or in genes not yet tested or identified to increase cancer risk.    We discussed the potential to find a Variant of Uncertain Significance or VUS.  These are variants that have not yet been identified as pathogenic or benign, and it is unknown if this variant is associated with increased cancer risk or if this is a normal finding.  Most VUS's are reclassified to benign or likely benign.   It should not be used to make medical management decisions. With time, we suspect the lab will determine the significance of any VUS's identified if any.   Based on Ms. Roehrig's personal and family history of cancer, she meets medical criteria for genetic testing. However, her sister has already had genetic testing relatively recently.  While we do not have her test report to review, it is very likely that she had panel testing.  Because Ms. Aki's sister had genetic testing that was negative, there is a low likelihood that Ms. Basher's genetic testing would reveal a mutation to explain the family history of cancer.  Thus, we did not recommend any genetic testing, at this time, but still gave Ms. Downie the option to pursue testing.  The laboratory can inform the laboratory if genetic testing will cost more than $100.  In our experience, patients with medicare insurance typically have no out of pocket cost.   PLAN: After considering the risks, benefits, and limitations, Ms. Mis  provided informed consent to pursue genetic testing and the blood sample was sent to [facility] for analysis of the Breast Cancer STAT Panel. Ms. Alcaraz would then like reflex testing for the Common Hereditary Cancer Panel (47 genes).  Initial STAT Panel results should be available within approximately 5-12 days' time, at which point they will be disclosed by telephone to Ms. Youngers, as will any additional recommendations warranted by these  results. Ms. Poch will receive a summary of her genetic counseling visit and a copy of her results once available. This information will also be available in Epic. We encouraged Ms. Ayars to remain in contact with cancer genetics annually so that we can continuously update the family history and inform her of any changes in cancer genetics and testing that may be of benefit for her family. Ms. Worthey questions were answered to her satisfaction today. Our contact information was provided should additional questions or concerns arise.   Lastly, we encouraged Ms. Mcqueary to remain in contact with cancer genetics annually so that we can  continuously update the family history and inform her of any changes in cancer genetics and testing that may be of benefit for this family.   Ms.  Swofford questions were answered to her satisfaction today. Our contact information was provided should additional questions or concerns arise. Thank you for the referral and allowing [place] to share in the care of your patient.   Tana Felts, MS Genetic Counselor [email or web address] phone: [phone]  The patient was seen for a total of 30 minutes in face-to-face genetic counseling.  The patient was accompanied today by her husband, Wille Glaser. This patient was discussed with Drs. Magrinat, Lindi Adie and/or Burr Medico who agrees with the above.

## 2017-08-10 ENCOUNTER — Other Ambulatory Visit: Payer: Self-pay | Admitting: Surgery

## 2017-08-10 ENCOUNTER — Encounter: Payer: Self-pay | Admitting: Oncology

## 2017-08-10 ENCOUNTER — Telehealth: Payer: Self-pay | Admitting: *Deleted

## 2017-08-10 DIAGNOSIS — Z170 Estrogen receptor positive status [ER+]: Principal | ICD-10-CM

## 2017-08-10 DIAGNOSIS — C50312 Malignant neoplasm of lower-inner quadrant of left female breast: Secondary | ICD-10-CM

## 2017-08-10 DIAGNOSIS — C50912 Malignant neoplasm of unspecified site of left female breast: Secondary | ICD-10-CM

## 2017-08-10 NOTE — Telephone Encounter (Signed)
  Oncology Nurse Navigator Documentation  Navigator Location: [facility] (08/10/17 1100)   )Navigator Encounter Type: Telephone (08/10/17 1100) Telephone: Outgoing Call;Clinic/MDC Follow-up (08/10/17 1100)     Surgery Date: 09/10/17 (08/10/17 1100)                                            Time Spent with Patient: 15 (08/10/17 1100)

## 2017-08-11 ENCOUNTER — Encounter: Payer: Self-pay | Admitting: Radiation Oncology

## 2017-08-12 ENCOUNTER — Telehealth: Payer: Self-pay | Admitting: Genetics

## 2017-08-12 ENCOUNTER — Ambulatory Visit: Payer: Self-pay | Admitting: Genetics

## 2017-08-12 ENCOUNTER — Encounter: Payer: Self-pay | Admitting: Genetics

## 2017-08-12 DIAGNOSIS — Z803 Family history of malignant neoplasm of breast: Secondary | ICD-10-CM

## 2017-08-12 DIAGNOSIS — C50312 Malignant neoplasm of lower-inner quadrant of left female breast: Secondary | ICD-10-CM

## 2017-08-12 DIAGNOSIS — Z170 Estrogen receptor positive status [ER+]: Principal | ICD-10-CM

## 2017-08-12 DIAGNOSIS — Z800 Family history of malignant neoplasm of digestive organs: Secondary | ICD-10-CM

## 2017-08-12 DIAGNOSIS — Z1379 Encounter for other screening for genetic and chromosomal anomalies: Secondary | ICD-10-CM

## 2017-08-12 DIAGNOSIS — D0512 Intraductal carcinoma in situ of left breast: Secondary | ICD-10-CM

## 2017-08-12 NOTE — Telephone Encounter (Signed)
12:00Pm on 08/12/2017- Revealed negative preliminary genetic test results.   2:15 08/12/2017-  Revealed negative genetic testing for the full panel.  This normal result is reassuring and indicates that it is unlikely Barbara Ferguson's cancer is due to a hereditary cause.  It is unlikely that there is an increased risk of another cancer due to a mutation in one of these genes.  However, genetic testing is not perfect, and cannot definitively rule out a hereditary cause.  It will be important for her to keep in contact with genetics to learn if any additional testing may be needed in the future.     Her daughter and other relatives should still inform her doctors about the family history of cancer so her doctors can make the appropriate risk assessment and screening recommendations for them.

## 2017-08-12 NOTE — Progress Notes (Signed)
HPI: Ms. Vandezande was previously seen in the [place] clinic on 08/05/2017 due to a personal and family history of cancer and concerns regarding a hereditary predisposition to cancer. Please refer to our prior cancer genetics clinic note for more information regarding Ms. Holtsclaw's medical, social and family histories, and our assessment and recommendations, at the time. Ms. Gaetano recent genetic test results were disclosed to her, as well as recommendations warranted by these results. These results and recommendations are discussed in more detail below.  CANCER HISTORY:    Ductal carcinoma in situ (DCIS) of left breast   07/28/2017 Initial Diagnosis    Ductal carcinoma in situ (DCIS) of left breast      08/12/2017 Genetic Testing    Patient had genetic testing due to a personal history of breast cancer and family history of breast and colon cancer.  The Common Hereditary Cancer Panel was ordered through [employer]. The Hereditary Gene Panel offered by [facility] includes sequencing and/or deletion duplication testing of the following 47 genes: APC, ATM, AXIN2, BARD1, BMPR1A, BRCA1, BRCA2, BRIP1, CDH1, CDKN2A (p14ARF), CDKN2A (p16INK4a), CKD4, CHEK2, CTNNA1, DICER1, EPCAM (Deletion/duplication testing only), GREM1 (promoter region deletion/duplication testing only), KIT, MEN1, MLH1, MSH2, MSH3, MSH6, MUTYH, NBN, NF1, NHTL1, PALB2, PDGFRA, PMS2, POLD1, POLE, PTEN, RAD50, RAD51C, RAD51D, SDHB, SDHC, SDHD, SMAD4, SMARCA4. STK11, TP53, TSC1, TSC2, and VHL.  The following genes were evaluated for sequence changes only: SDHA and HOXB13 c.251G>A variant only.    Results: Negative- no pathogenic variants identified.  The date of this test report is 08/12/2017        FAMILY HISTORY:  We obtained a detailed, 4-generation family history.  Significant diagnoses are listed below: Family History  Problem Relation Age of Onset  . Breast cancer Sister 72       had Genetic testing in ~2016,  reportedly negative  . Stroke Mother   . Colon cancer Maternal Grandmother 43  . Heart disease Maternal Grandfather    Ms. Brass has a 93 year-old son and a 63 year-old daughter who have no history of cancer.  Ms. Collantes daughter has 2 children with no history of cancer.  Ms. Colberg's sister was diagnosed with breast cancer at 14.  She reportedly had genetic testing for cancer risk about 2 years ago (2016) that was negative.  The test report was unavailable for review. This sister has her uterus and ovaries still intact, and has 2 children with no history of cancer. Ms. Jeanmarie also has a maternal half-brother who is 73 with no history of cancer or any children.    Ms. Calico's father died at 36 with no history of cancer.  Ms. Bauder has 2 paternal aunts who died at ages greater than 69 years with no known history of cancer.  Ms. Mcmanamon has several paternal cousins, none with any known history of cancer. Ms. Brisby knows very little about her paternal grandparents.  She reports her died in his 64's, the cause is unknown, and her paternal grandmother died in her 87's.  Ms. Mangen's mother died at age 12 due to a stroke.  She had a hysterectomy in her 76's.  Ms. Bame has 2 maternal uncles and 1 maternal aunt who all died in their 46's and 53's with no history of cancer.  Ms. Rupert has several maternal cousins, none with any known history of cancer.  Ms. Mikhail's maternal grandfather died in his 48's due to heart disease and her maternal grandmother was diagnosed with colon  cancer in her 16's.  She died in her 58's due to Alzheimer's disease.  Ms. Gibeault is not aware of any other cancers on her maternal grandmother's side of the family.    Patient's maternal ancestors are of Native American/Northern European descent, and paternal ancestors are of [place] descent. There is no reported Ashkenazi Jewish ancestry. There is no known consanguinity.  GENETIC TEST RESULTS: Genetic testing performed through [facility]'s Common  Hereditary Cancer Panel reported out on 08/12/2017 showed no pathogenic mutations. The Hereditary Gene Panel offered by [facility] includes sequencing and/or deletion duplication testing of the following 47 genes: APC, ATM, AXIN2, BARD1, BMPR1A, BRCA1, BRCA2, BRIP1, CDH1, CDKN2A (p14ARF), CDKN2A (p16INK4a), CKD4, CHEK2, CTNNA1, DICER1, EPCAM (Deletion/duplication testing only), GREM1 (promoter region deletion/duplication testing only), KIT, MEN1, MLH1, MSH2, MSH3, MSH6, MUTYH, NBN, NF1, NHTL1, PALB2, PDGFRA, PMS2, POLD1, POLE, PTEN, RAD50, RAD51C, RAD51D, SDHB, SDHC, SDHD, SMAD4, SMARCA4. STK11, TP53, TSC1, TSC2, and VHL.  The following genes were evaluated for sequence changes only: SDHA and HOXB13 c.251G>A variant only..  The test report will be scanned into EPIC and will be located under the Molecular Pathology section of the Results Review tab.A portion of the result report is included below for reference.     We discussed with Ms. Zarazua that because current genetic testing is not perfect, it is possible there may be a gene mutation in one of these genes that current testing cannot detect, but that chance is small. We also discussed, that there could be another gene that has not yet been discovered, or that we have not yet tested, that is responsible for the cancer diagnoses in the family. Therefore, it is important to remain in touch with cancer genetics in the future so that we can continue to offer Ms. Rasmusson the most up to date genetic testing.   ADDITIONAL GENETIC TESTING: We discussed with Ms. Finlayson that there are other genes that are associated with increased cancer risk that can be analyzed. The laboratories that offer this testing look at these additional genes via a hereditary cancer gene panel. Should Ms. Denman wish to pursue additional genetic testing, we are happy to discuss and coordinate this testing, at any time.    CANCER SCREENING RECOMMENDATIONS: This result is somewhat reassuring and  indicates that it is unlikely Ms. Foody has an increased risk for a future cancer due to a mutation in one of these genes. This normal test also suggests that Ms. Recktenwald's cancer was most likely not due to an inherited predisposition associated with one of these genes.  Most cancers happen by chance and this negative test suggests that her cancer may fall into this category.  However, genetic testing is not perfect and cannot rule out the possibility for a hereditary cause for her cancer. Therefore, it is recommended she continue to follow the cancer management and screening guidelines provided by her oncology and primary healthcare provider. Other factors such as her personal and family history may still affect her cancer risk.  RECOMMENDATIONS FOR FAMILY MEMBERS: Women in this family might be at some increased risk of developing cancer, over the general population risk, simply due to the family history of cancer. We recommended women in this family have a yearly mammogram beginning at age 42, or 59 years younger than the earliest onset of cancer, an annual clinical breast exam, and perform monthly breast self-exams.  Women in the family should inform their doctors about the family history of cancer so they can assess their individual risk  and make appropriate screening recommendations.  Women in this family should also have a gynecological exam as recommended by their primary provider. All family members should have a colonoscopy by age 43. (or earlier depending on family history)  Ms. Solorio's sister had genetic testing reportedly 2 years ago.  At this time it is likely that she had panel genetic testing.  However, we recommend she confirm that she did have testing for a panel of breast cancer genes (rather than BRCA1 and BRCA2 only).    FOLLOW-UP: Lastly, we discussed with Ms. Everetts that cancer genetics is a rapidly advancing field and it is possible that new genetic tests will be appropriate for her and/or  her family members in the future. We encouraged her to remain in contact with cancer genetics on an annual basis so we can update her personal and family histories and let her know of advances in cancer genetics that may benefit this family.   Our contact number was provided. Ms. Gosney questions were answered to her satisfaction, and she knows she is welcome to call us at anytime with additional questions or concerns.   Ferol Luz, MS Genetic Counselor [email or web address]

## 2017-08-24 ENCOUNTER — Ambulatory Visit: Payer: Medicare Other

## 2017-08-24 NOTE — Progress Notes (Signed)
Nutrition Assessment   Reason for Assessment:   Patient interested in learning more about nutrition for upcoming surgery and heart health as would like to stay off statin  ASSESSMENT:  71 year old female with breast cancer.  Planning lumpectomy on 09/10/17 followed by radiation therapy. Past medical history of 1 kidney (CKD stage 3 per patient).    Met with patient in clinic this am.  Patient wanting to learn more about nutrition for upcoming surgery and heart health as she would like to stay off statin therapy.   Medications: reviewed  Labs: reviewed  Anthropometrics:   Height: 62 inches Weight: 126 lb UBW: current wt BMI: 23  Estimated energy needs: Calories: 1425-1710 calories/d Protein: 57-68 g/d (1.0-1.2 g/kg)  Fluid: 1.7 L/d  NUTRITION DIAGNOSIS: Food and nutrition related knowledge deficit related to breast cancer and kidney concerns as evidenced by no prior need for nutrition education   INTERVENTION:   Discussed current recommendations regarding plant based diet including fruits and vegetables, whole grains, lean sources of protein (plant based and animal based).   Discussed protein sources and protein goals with patient for upcoming surgery with kidney concerns.  Discussed heart healthy eating strategies as well. Education materials provided to patient.      MONITORING, EVALUATION, GOAL: Patient will consume plant based diet to maintain weight during treatment.    NEXT VISIT:  Nutrition diagnosis resolved.  No further nutrition intervention needed at this time.  Patient has contact information  Joli B. Allen, RD, LDN Registered Dietitian [phone] (pager)       

## 2017-09-06 ENCOUNTER — Other Ambulatory Visit: Payer: Self-pay

## 2017-09-06 ENCOUNTER — Encounter (HOSPITAL_COMMUNITY)
Admission: RE | Admit: 2017-09-06 | Discharge: 2017-09-06 | Disposition: A | Discharge status: Home / Self Care | Payer: Medicare Other | Source: Ambulatory Visit | Attending: Surgery | Admitting: Surgery

## 2017-09-06 ENCOUNTER — Encounter (HOSPITAL_COMMUNITY): Payer: Self-pay

## 2017-09-06 DIAGNOSIS — D0512 Intraductal carcinoma in situ of left breast: Secondary | ICD-10-CM | POA: Diagnosis not present

## 2017-09-06 DIAGNOSIS — Z170 Estrogen receptor positive status [ER+]: Secondary | ICD-10-CM | POA: Diagnosis not present

## 2017-09-06 DIAGNOSIS — Z01812 Encounter for preprocedural laboratory examination: Secondary | ICD-10-CM | POA: Diagnosis not present

## 2017-09-06 DIAGNOSIS — C50312 Malignant neoplasm of lower-inner quadrant of left female breast: Secondary | ICD-10-CM | POA: Insufficient documentation

## 2017-09-06 DIAGNOSIS — Z803 Family history of malignant neoplasm of breast: Secondary | ICD-10-CM | POA: Diagnosis not present

## 2017-09-06 LAB — CBC
HEMATOCRIT: 38.1 % (ref 36.0–46.0)
HEMOGLOBIN: 12.6 g/dL (ref 12.0–15.0)
MCH: 30.1 pg (ref 26.0–34.0)
MCHC: 33.1 g/dL (ref 30.0–36.0)
MCV: 90.9 fL (ref 78.0–100.0)
Platelets: 257 10*3/uL (ref 150–400)
RBC: 4.19 MIL/uL (ref 3.87–5.11)
RDW: 14.3 % (ref 11.5–15.5)
WBC: 7.6 10*3/uL (ref 4.0–10.5)

## 2017-09-06 NOTE — Progress Notes (Signed)
Denies any cardiac history or cardiac testing

## 2017-09-06 NOTE — Pre-Procedure Instructions (Signed)
Barbara Ferguson  09/06/2017      [facility] [place] [place] [address] Phone: [phone] Fax: [phone]    Your procedure is scheduled on 09-10-2017 Friday.  Report to [facility] Admitting at 11:00 A.M.   Call this number if you have problems the morning of surgery:  [phone]   Remember:  Do not eat food or drink liquids after midnight.   Take these medicines the morning of surgery with A SIP OF WATER Levothyroxine(Synthroid)   Drink bottle of Ensure Pre-Surgery before leaving for hospital  STOP ASPIRIN,ANTIINFLAMATORIES (IBUPROFEN,ALEVE,MOTRIN,ADVIL,GOODY'S POWDERS),HERBAL SUPPLEMENTS,FISH OIL,AND VITAMINS 5-7 DAYS PRIOR TO SURGERY   Do not wear jewelry, make-up or nail polish.  Do not wear lotions, powders, or perfumes, or deoderant.  Do not shave 48 hours prior to surgery.  Men may shave face and neck.   Do not bring valuables to the hospital.  [facility] is not responsible for any belongings or valuables.  Contacts, dentures or bridgework may not be worn into surgery.  Leave your suitcase in the car.  After surgery it may be brought to your room.  For patients admitted to the hospital, discharge time will be determined by your treatment team.  Patients discharged the day of surgery will not be allowed to drive home.    Special Instructions: [facility] - Preparing for Surgery  Before surgery, you can play an important role.  Because skin is not sterile, your skin needs to be as free of germs as possible.  You can reduce the number of germs on you skin by washing with CHG (chlorahexidine gluconate) soap before surgery.  CHG is an antiseptic cleaner which kills germs and bonds with the skin to continue killing germs even after washing.  Please DO NOT use if you have an allergy to CHG or antibacterial soaps.  If your skin becomes reddened/irritated  stop using the CHG and inform your nurse when you arrive at Short Stay.  Do not shave (including legs and underarms) for at least 48 hours prior to the first CHG shower.  You may shave your face.  Please follow these instructions carefully:   1.  Shower with CHG Soap the night before surgery and the   morning of Surgery.  2.  If you choose to wash your hair, wash your hair first as usual with your normal shampoo.  3.  After you shampoo, rinse your hair and body thoroughly to remove the  Shampoo.  4.  Use CHG as you would any other liquid soap.  You can apply chg directly  to the skin and wash gently with scrungie or a clean washcloth.  5.  Apply the CHG Soap to your body ONLY FROM THE NECK DOWN.   Do not use on open wounds or open sores.  Avoid contact with your eyes,  ears, mouth and genitals (private parts).  Wash genitals (private parts) with your normal soap.  6.  Wash thoroughly, paying special attention to the area where your surgery will be performed.  7.  Thoroughly rinse your body with warm water from the neck down.  8.  DO NOT shower/wash with your normal soap after using and rinsing o  the CHG Soap.  9.  Pat yourself dry with a clean towel.            10.  Wear clean pajamas.  11.  Place clean sheets on your bed the night of your first shower and do not sleep with pets.  Day of Surgery  Do not apply any lotions/deodorants the morning of surgery.  Please wear clean clothes to the hospital/surgery center.   Please read over the following fact sheets that you were given. Coughing and Deep Breathing and Surgical Site Infection Prevention

## 2017-09-08 ENCOUNTER — Ambulatory Visit
Admission: RE | Admit: 2017-09-08 | Discharge: 2017-09-08 | Disposition: A | Discharge status: Home / Self Care | Payer: Medicare Other | Source: Ambulatory Visit | Attending: Surgery | Admitting: Surgery

## 2017-09-08 DIAGNOSIS — Z170 Estrogen receptor positive status [ER+]: Principal | ICD-10-CM

## 2017-09-08 DIAGNOSIS — C50912 Malignant neoplasm of unspecified site of left female breast: Secondary | ICD-10-CM

## 2017-09-09 NOTE — H&P (Signed)
Barbara Ferguson  Location: [facility] Surgery Patient #: [id number] DOB: 1945/11/14 Undefined / Language: Cleophus Molt / Race: White Female  History of Present Illness   The patient is a 71 year old female who presents with a complaint of left breast cancer.  The PCP is Dr. Charmaine Downs  The patient was referred by Dr. Arn Medal  The pateint is at the Breast [facility] - Oncology is Drs. Magrinat and Isidore Moos  She is accompanied by her husband, Barbara Ferguson  She had both her knees replaced last year by Dr. Lorre Nick, so she took a-year-old from her mammograms. She had a sister at age 33 who had breast cancer. Her sister is still alive and doing well. she did not feel a mass in her breast. She did have a remote history of a biopsy her left breast done in [place]?? her last period was when she was around age 52. She is not on hormone replacement.  [facility] - A 12 mm group of increasing amorphous and round calcifications are noted within the lower slightly inner left breast with associated density. Biopsy: Left breast biopsy - 07/23/2017 ([id number]) - DCIS, suspicious for invasive ductal ca, grade 2, ER - 100%, PR - 90% Family history of breast or ovarian cancer: sister with breast cancer On hormone therapy: No  I discussed the options for breast cancer treatment with the patient. The patient is at the [facility], which includes medical oncology and radiation oncology. I discussed the surgical options of lumpectomy vs. mastectomy. If mastectomy, there is the possibility of reconstruction. I discussed the options of lymph node biopsy. The treatment plan depends on the pathologic staging of the tumor and the patient's personal wishes. The risks of surgery include, but are not limited to, bleeding, infection, the need for further surgery, and nerve injury. The patient has been given literature on the  treatment of breast cancer.  Plan: 1) Left breast lumpectomy with SLNBx, 2) Rad Tx, 3) Antiestrogen, 4) Genetics  Past Medical History: 1 Hypothyroid - on thyroid meds for 30 years 2. 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 3. She had a colonoscopy 2 years ago 4. Stage 3 CKD  Social History: She is accompanied by her husband, Barbara Ferguson She two children: Son, [name], 47 yo (he got her kidney) in [place] and daughter, 21, [place].  She is a retired Chiropodist  Past Surgical History Tawni Pummel, RN; 08/04/2017 7:30 AM) Breast Biopsy  Left. Cesarean Section - 1  Colon Polyp Removal - Colonoscopy  Knee Surgery  Bilateral. Nephrectomy  Right. Oral Surgery  Tonsillectomy   Diagnostic Studies History Tawni Pummel, RN; 08/04/2017 7:30 AM) Colonoscopy  5-10 years ago Mammogram  within last year Pap Smear  >5 years ago  Medication History Tawni Pummel, RN; 08/04/2017 7:31 AM) Medications Reconciled  Social History Tawni Pummel, RN; 08/04/2017 7:30 AM) Alcohol use  Moderate alcohol use. Caffeine use  Coffee, Tea. No drug use  Tobacco use  Former smoker.  Family History Tawni Pummel, RN; 08/04/2017 7:30 AM) Alcohol Abuse  Father. Arthritis  Mother. Breast Cancer  Sister. Cerebrovascular Accident  Mother. Colon Cancer  Family Members In General. Kidney Disease  Son.  Pregnancy / Birth History Tawni Pummel, RN; 08/04/2017 7:30 AM) Age at menarche  49 years. Age of menopause  6-50 Contraceptive History  Oral contraceptives. Gravida  5 Length (months) of breastfeeding  3-6 Maternal age  10-25 Para  60  Other  Problems Tawni Pummel, RN; 08/04/2017 7:30 AM) Arthritis  Gastroesophageal Reflux Disease  Hemorrhoids  Kidney Stone     Review of Systems Sunday Spillers Ledford RN; 08/04/2017 7:30 AM) General Not Present- Appetite Loss, Chills, Fatigue, Fever, Night Sweats, Weight Gain and Weight  Loss. Skin Not Present- Change in Wart/Mole, Dryness, Hives, Jaundice, New Lesions, Non-Healing Wounds, Rash and Ulcer. HEENT Present- Seasonal Allergies and Wears glasses/contact lenses. Not Present- Earache, Hearing Loss, Hoarseness, Nose Bleed, Oral Ulcers, Ringing in the Ears, Sinus Pain, Sore Throat, Visual Disturbances and Yellow Eyes. Respiratory Not Present- Bloody sputum, Chronic Cough, Difficulty Breathing, Snoring and Wheezing. Breast Present- Breast Mass. Not Present- Breast Pain, Nipple Discharge and Skin Changes. Cardiovascular Not Present- Chest Pain, Difficulty Breathing Lying Down, Leg Cramps, Palpitations, Rapid Heart Rate, Shortness of Breath and Swelling of Extremities. Gastrointestinal Not Present- Abdominal Pain, Bloating, Bloody Stool, Change in Bowel Habits, Chronic diarrhea, Constipation, Difficulty Swallowing, Excessive gas, Gets full quickly at meals, Hemorrhoids, Indigestion, Nausea, Rectal Pain and Vomiting. Female Genitourinary Not Present- Frequency, Nocturia, Painful Urination, Pelvic Pain and Urgency. Musculoskeletal Not Present- Back Pain, Joint Pain, Joint Stiffness, Muscle Pain, Muscle Weakness and Swelling of Extremities. Neurological Present- Numbness. Not Present- Decreased Memory, Fainting, Headaches, Seizures, Tingling, Tremor, Trouble walking and Weakness. Psychiatric Not Present- Anxiety, Bipolar, Change in Sleep Pattern, Depression, Fearful and Frequent crying. Endocrine Not Present- Cold Intolerance, Excessive Hunger, Hair Changes, Heat Intolerance, Hot flashes and New Diabetes. Hematology Not Present- Blood Thinners, Easy Bruising, Excessive bleeding, Gland problems, HIV and Persistent Infections.   Physical Exam  General: Thinner older WFalert and generally healthy appearing. Skin: Inspection and palpation of the skin unremarkable.  Eyes: Conjunctivae white, pupils equal. Face, ears, nose, mouth, and throat: Face - normal. Normal ears and nose.  Lips and teeth normal.  Neck: Supple. No mass. Trachea midline. No thyroid mass.  Lymph Nodes: No supraclavicular or cervical adenopathy. No axillary adenopathy.  Lungs: Normal respiratory effort. Clear to auscultation and symmetric breath sounds. Cardiovascular: Regular rate and rythm. Normal auscultation of the heart. No murmur or rub.  Breast: Right - small breasts, no mass  Left - bruise in the LIQ of the left breast. She has some fullness at the biopsy site.  Abdomen: Soft. No mass. Liver and spleen not palpable. No tenderness. No hernia. Normal bowel sounds.  Pfanansteil incision for kidney extraction  Musculoskeletal/extremities: Normal gait. Good strength and ROM in upper and lower extremities.   Neurologic: Grossly intact to motor and sensory function.   Psychiatric: Has normal mood and affect. Judgement and insight appear normal.   Assessment & Plan  1.  BREAST CANCER, STAGE 0, LEFT (D05.92)  Story: Biopsy: Left breast biopsy - 07/23/2017 ([id number]) - DCIS, suspicious for invasive ductal ca, grade 2, ER - 100%, PR - 90%  Oncology - Magrinat and [place]:   1) Left breast lumpectomy with SLNBx,   2) Rad Tx,   3) Antiestrogen   4) Genetics Addendum Note(Tyleah Loh H. Lucia Gaskins MD; 08/19/2017 11:51 AM) Genetics were negative.  2 Hypothyroid - on thyroid meds for 30 years 3. 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 4. She had a colonoscopy 2 years ago 5. Stage 3 CKD   Alphonsa Overall, MD, [facility] Pager: [phone] Office phone:  [phone]

## 2017-09-10 ENCOUNTER — Encounter (HOSPITAL_COMMUNITY): Payer: Self-pay

## 2017-09-10 ENCOUNTER — Ambulatory Visit (HOSPITAL_COMMUNITY)
Admission: RE | Admit: 2017-09-10 | Discharge: 2017-09-10 | Disposition: A | Discharge status: Home / Self Care | Payer: Medicare Other | Source: Ambulatory Visit | Attending: Surgery | Admitting: Surgery

## 2017-09-10 ENCOUNTER — Encounter (HOSPITAL_COMMUNITY)
Admission: RE | Disposition: A | Discharge status: Home / Self Care | Payer: Self-pay | Source: Ambulatory Visit | Attending: Surgery

## 2017-09-10 ENCOUNTER — Ambulatory Visit (HOSPITAL_COMMUNITY): Payer: Medicare Other | Admitting: Anesthesiology

## 2017-09-10 ENCOUNTER — Ambulatory Visit
Admission: RE | Admit: 2017-09-10 | Discharge: 2017-09-10 | Disposition: A | Discharge status: Home / Self Care | Payer: Medicare Other | Source: Ambulatory Visit | Attending: Surgery | Admitting: Surgery

## 2017-09-10 DIAGNOSIS — Z803 Family history of malignant neoplasm of breast: Secondary | ICD-10-CM | POA: Diagnosis not present

## 2017-09-10 DIAGNOSIS — E039 Hypothyroidism, unspecified: Secondary | ICD-10-CM | POA: Insufficient documentation

## 2017-09-10 DIAGNOSIS — D0512 Intraductal carcinoma in situ of left breast: Secondary | ICD-10-CM | POA: Insufficient documentation

## 2017-09-10 DIAGNOSIS — K219 Gastro-esophageal reflux disease without esophagitis: Secondary | ICD-10-CM | POA: Insufficient documentation

## 2017-09-10 DIAGNOSIS — Z79899 Other long term (current) drug therapy: Secondary | ICD-10-CM | POA: Insufficient documentation

## 2017-09-10 DIAGNOSIS — M1990 Unspecified osteoarthritis, unspecified site: Secondary | ICD-10-CM | POA: Diagnosis not present

## 2017-09-10 DIAGNOSIS — Z87891 Personal history of nicotine dependence: Secondary | ICD-10-CM | POA: Insufficient documentation

## 2017-09-10 DIAGNOSIS — C50912 Malignant neoplasm of unspecified site of left female breast: Secondary | ICD-10-CM

## 2017-09-10 DIAGNOSIS — N183 Chronic kidney disease, stage 3 (moderate): Secondary | ICD-10-CM | POA: Diagnosis not present

## 2017-09-10 DIAGNOSIS — Z170 Estrogen receptor positive status [ER+]: Principal | ICD-10-CM

## 2017-09-10 HISTORY — PX: BREAST LUMPECTOMY WITH RADIOACTIVE SEED AND SENTINEL LYMPH NODE BIOPSY: SHX6550

## 2017-09-10 SURGERY — BREAST LUMPECTOMY WITH RADIOACTIVE SEED AND SENTINEL LYMPH NODE BIOPSY
Anesthesia: Regional | Site: Breast | Laterality: Left

## 2017-09-10 MED ORDER — PHENYLEPHRINE 40 MCG/ML (10ML) SYRINGE FOR IV PUSH (FOR BLOOD PRESSURE SUPPORT)
PREFILLED_SYRINGE | INTRAVENOUS | Status: DC | PRN
  Administered 2017-09-10 (×2): 80 ug via INTRAVENOUS

## 2017-09-10 MED ORDER — LIDOCAINE 2% (20 MG/ML) 5 ML SYRINGE
INTRAMUSCULAR | Status: AC
  Filled 2017-09-10: qty 5

## 2017-09-10 MED ORDER — KETOROLAC TROMETHAMINE 30 MG/ML IJ SOLN
INTRAMUSCULAR | Status: AC
  Administered 2017-09-10: 30 mg via INTRAVENOUS
  Filled 2017-09-10: qty 1

## 2017-09-10 MED ORDER — SCOPOLAMINE 1 MG/3DAYS TD PT72
MEDICATED_PATCH | TRANSDERMAL | Status: AC
  Filled 2017-09-10: qty 1

## 2017-09-10 MED ORDER — BUPIVACAINE-EPINEPHRINE 0.25% -1:200000 IJ SOLN
INTRAMUSCULAR | Status: DC | PRN
  Administered 2017-09-10: 20 mL

## 2017-09-10 MED ORDER — GABAPENTIN 300 MG PO CAPS
300.0000 mg | ORAL_CAPSULE | ORAL | Status: AC
  Administered 2017-09-10: 300 mg via ORAL
  Filled 2017-09-10: qty 1

## 2017-09-10 MED ORDER — LIDOCAINE 2% (20 MG/ML) 5 ML SYRINGE
INTRAMUSCULAR | Status: DC | PRN
  Administered 2017-09-10: 100 mg via INTRAVENOUS

## 2017-09-10 MED ORDER — SODIUM CHLORIDE 0.9 % IJ SOLN
INTRAMUSCULAR | Status: AC
  Filled 2017-09-10: qty 10

## 2017-09-10 MED ORDER — ONDANSETRON HCL 4 MG/2ML IJ SOLN
INTRAMUSCULAR | Status: DC | PRN
  Administered 2017-09-10: 4 mg via INTRAVENOUS

## 2017-09-10 MED ORDER — PROPOFOL 10 MG/ML IV BOLUS
INTRAVENOUS | Status: DC | PRN
  Administered 2017-09-10: 150 mg via INTRAVENOUS

## 2017-09-10 MED ORDER — ONDANSETRON HCL 4 MG/2ML IJ SOLN: 4.0000 mg | Freq: Once | INTRAMUSCULAR | Status: DC | PRN

## 2017-09-10 MED ORDER — PHENYLEPHRINE 40 MCG/ML (10ML) SYRINGE FOR IV PUSH (FOR BLOOD PRESSURE SUPPORT)
PREFILLED_SYRINGE | INTRAVENOUS | Status: AC
  Filled 2017-09-10: qty 10

## 2017-09-10 MED ORDER — BUPIVACAINE-EPINEPHRINE (PF) 0.25% -1:200000 IJ SOLN
INTRAMUSCULAR | Status: AC
  Filled 2017-09-10: qty 30

## 2017-09-10 MED ORDER — ROPIVACAINE HCL 5 MG/ML IJ SOLN
INTRAMUSCULAR | Status: DC | PRN
  Administered 2017-09-10 (×6): 5 mL via PERINEURAL

## 2017-09-10 MED ORDER — METHYLENE BLUE 0.5 % INJ SOLN
INTRAVENOUS | Status: AC
  Filled 2017-09-10: qty 10

## 2017-09-10 MED ORDER — CEFAZOLIN SODIUM-DEXTROSE 2-4 GM/100ML-% IV SOLN
2.0000 g | INTRAVENOUS | Status: AC
  Administered 2017-09-10: 2 g via INTRAVENOUS
  Filled 2017-09-10: qty 100

## 2017-09-10 MED ORDER — SODIUM CHLORIDE 0.9 % IJ SOLN
INTRAMUSCULAR | Status: DC | PRN
  Administered 2017-09-10: 1 mL via INTRAMUSCULAR

## 2017-09-10 MED ORDER — MIDAZOLAM HCL 2 MG/2ML IJ SOLN
INTRAMUSCULAR | Status: AC
  Administered 2017-09-10: 2 mg via INTRAVENOUS
  Filled 2017-09-10: qty 2

## 2017-09-10 MED ORDER — MIDAZOLAM HCL 2 MG/2ML IJ SOLN
2.0000 mg | Freq: Once | INTRAMUSCULAR | Status: AC
  Administered 2017-09-10: 2 mg via INTRAVENOUS

## 2017-09-10 MED ORDER — EPHEDRINE 5 MG/ML INJ
INTRAVENOUS | Status: AC
  Filled 2017-09-10: qty 10

## 2017-09-10 MED ORDER — PROPOFOL 10 MG/ML IV BOLUS
INTRAVENOUS | Status: AC
  Filled 2017-09-10: qty 20

## 2017-09-10 MED ORDER — 0.9 % SODIUM CHLORIDE (POUR BTL) OPTIME
TOPICAL | Status: DC | PRN
  Administered 2017-09-10: 1000 mL

## 2017-09-10 MED ORDER — MEPERIDINE HCL 25 MG/ML IJ SOLN: 6.2500 mg | INTRAMUSCULAR | Status: DC | PRN

## 2017-09-10 MED ORDER — ACETAMINOPHEN 325 MG PO TABS: 325.0000 mg | ORAL_TABLET | ORAL | Status: DC | PRN

## 2017-09-10 MED ORDER — DEXAMETHASONE SODIUM PHOSPHATE 4 MG/ML IJ SOLN
INTRAMUSCULAR | Status: DC | PRN
  Administered 2017-09-10: 8 mg via INTRAVENOUS

## 2017-09-10 MED ORDER — ACETAMINOPHEN 500 MG PO TABS
1000.0000 mg | ORAL_TABLET | ORAL | Status: AC
  Administered 2017-09-10: 1000 mg via ORAL
  Filled 2017-09-10: qty 2

## 2017-09-10 MED ORDER — HYDROCODONE-ACETAMINOPHEN 5-325 MG PO TABS: 1.0000 | ORAL_TABLET | Freq: Four times a day (QID) | ORAL | 0 refills | Status: DC | PRN

## 2017-09-10 MED ORDER — KETOROLAC TROMETHAMINE 30 MG/ML IJ SOLN
30.0000 mg | Freq: Once | INTRAMUSCULAR | Status: DC | PRN
Start: 2017-09-10 — End: 2017-09-10
  Administered 2017-09-10: 30 mg via INTRAVENOUS

## 2017-09-10 MED ORDER — FENTANYL CITRATE (PF) 100 MCG/2ML IJ SOLN
INTRAMUSCULAR | Status: DC | PRN
  Administered 2017-09-10: 25 ug via INTRAVENOUS
  Administered 2017-09-10: 50 ug via INTRAVENOUS

## 2017-09-10 MED ORDER — OXYCODONE HCL 5 MG/5ML PO SOLN: 5.0000 mg | Freq: Once | ORAL | Status: AC | PRN

## 2017-09-10 MED ORDER — LACTATED RINGERS IV SOLN
INTRAVENOUS | Status: DC | PRN
  Administered 2017-09-10 (×2): via INTRAVENOUS

## 2017-09-10 MED ORDER — FENTANYL CITRATE (PF) 100 MCG/2ML IJ SOLN
100.0000 ug | Freq: Once | INTRAMUSCULAR | Status: AC
  Administered 2017-09-10: 100 ug via INTRAVENOUS

## 2017-09-10 MED ORDER — LACTATED RINGERS IV SOLN
INTRAVENOUS | Status: DC
  Administered 2017-09-10: 10:00:00 via INTRAVENOUS

## 2017-09-10 MED ORDER — SCOPOLAMINE 1 MG/3DAYS TD PT72
MEDICATED_PATCH | TRANSDERMAL | Status: DC | PRN
  Administered 2017-09-10: 1 via TRANSDERMAL

## 2017-09-10 MED ORDER — EPHEDRINE SULFATE-NACL 50-0.9 MG/10ML-% IV SOSY
PREFILLED_SYRINGE | INTRAVENOUS | Status: DC | PRN
  Administered 2017-09-10 (×3): 10 mg via INTRAVENOUS

## 2017-09-10 MED ORDER — FENTANYL CITRATE (PF) 100 MCG/2ML IJ SOLN: 25.0000 ug | INTRAMUSCULAR | Status: DC | PRN

## 2017-09-10 MED ORDER — OXYCODONE HCL 5 MG PO TABS
ORAL_TABLET | ORAL | Status: AC
  Administered 2017-09-10: 5 mg via ORAL
  Filled 2017-09-10: qty 1

## 2017-09-10 MED ORDER — FENTANYL CITRATE (PF) 250 MCG/5ML IJ SOLN
INTRAMUSCULAR | Status: AC
  Filled 2017-09-10: qty 5

## 2017-09-10 MED ORDER — CHLORHEXIDINE GLUCONATE CLOTH 2 % EX PADS: 6.0000 | MEDICATED_PAD | Freq: Once | CUTANEOUS | Status: DC

## 2017-09-10 MED ORDER — ONDANSETRON HCL 4 MG/2ML IJ SOLN
INTRAMUSCULAR | Status: AC
  Filled 2017-09-10: qty 2

## 2017-09-10 MED ORDER — DEXAMETHASONE SODIUM PHOSPHATE 10 MG/ML IJ SOLN
INTRAMUSCULAR | Status: AC
Start: 2017-09-10 — End: 2017-09-10
  Filled 2017-09-10: qty 1

## 2017-09-10 MED ORDER — OXYCODONE HCL 5 MG PO TABS
5.0000 mg | ORAL_TABLET | Freq: Once | ORAL | Status: AC | PRN
  Administered 2017-09-10: 5 mg via ORAL

## 2017-09-10 MED ORDER — MIDAZOLAM HCL 2 MG/2ML IJ SOLN
INTRAMUSCULAR | Status: AC
  Filled 2017-09-10: qty 2

## 2017-09-10 MED ORDER — TECHNETIUM TC 99M SULFUR COLLOID FILTERED
1.0000 | Freq: Once | INTRAVENOUS | Status: AC | PRN
  Administered 2017-09-10: 1 via INTRADERMAL

## 2017-09-10 MED ORDER — FENTANYL CITRATE (PF) 100 MCG/2ML IJ SOLN
INTRAMUSCULAR | Status: AC
  Administered 2017-09-10: 100 ug via INTRAVENOUS
  Filled 2017-09-10: qty 2

## 2017-09-10 MED ORDER — ACETAMINOPHEN 160 MG/5ML PO SOLN: 325.0000 mg | ORAL | Status: DC | PRN

## 2017-09-10 SURGICAL SUPPLY — 45 items
BINDER BREAST LRG (GAUZE/BANDAGES/DRESSINGS) ×3 IMPLANT
BINDER BREAST XLRG (GAUZE/BANDAGES/DRESSINGS) IMPLANT
BLADE SURG 15 STRL LF DISP TIS (BLADE) ×1 IMPLANT
BLADE SURG 15 STRL SS (BLADE) ×2
CANISTER SUCT 3000ML PPV (MISCELLANEOUS) ×3 IMPLANT
CHLORAPREP W/TINT 26ML (MISCELLANEOUS) ×3 IMPLANT
CLIP VESOCCLUDE SM WIDE 6/CT (CLIP) ×3 IMPLANT
COVER PROBE W GEL 5X96 (DRAPES) ×3 IMPLANT
COVER SURGICAL LIGHT HANDLE (MISCELLANEOUS) ×3 IMPLANT
DERMABOND ADVANCED (GAUZE/BANDAGES/DRESSINGS) ×2
DERMABOND ADVANCED .7 DNX12 (GAUZE/BANDAGES/DRESSINGS) ×1 IMPLANT
DEVICE DUBIN SPECIMEN MAMMOGRA (MISCELLANEOUS) ×3 IMPLANT
DRAPE CHEST BREAST 15X10 FENES (DRAPES) ×3 IMPLANT
DRAPE UTILITY XL STRL (DRAPES) ×3 IMPLANT
ELECT COATED BLADE 2.86 ST (ELECTRODE) ×3 IMPLANT
ELECT REM PT RETURN 9FT ADLT (ELECTROSURGICAL) ×3
ELECTRODE REM PT RTRN 9FT ADLT (ELECTROSURGICAL) ×1 IMPLANT
GAUZE SPONGE 4X4 12PLY STRL (GAUZE/BANDAGES/DRESSINGS) ×3 IMPLANT
GAUZE SPONGE 4X4 12PLY STRL LF (GAUZE/BANDAGES/DRESSINGS) ×3 IMPLANT
GLOVE SURG SIGNA 7.5 PF LTX (GLOVE) ×6 IMPLANT
GOWN STRL REUS W/ TWL LRG LVL3 (GOWN DISPOSABLE) ×1 IMPLANT
GOWN STRL REUS W/ TWL XL LVL3 (GOWN DISPOSABLE) ×1 IMPLANT
GOWN STRL REUS W/TWL LRG LVL3 (GOWN DISPOSABLE) ×2
GOWN STRL REUS W/TWL XL LVL3 (GOWN DISPOSABLE) ×2
KIT BASIN OR (CUSTOM PROCEDURE TRAY) ×3 IMPLANT
KIT MARKER MARGIN INK (KITS) ×3 IMPLANT
NDL SAFETY ECLIPSE 18X1.5 (NEEDLE) ×1 IMPLANT
NEEDLE FILTER BLUNT 18X 1/2SAF (NEEDLE) ×2
NEEDLE FILTER BLUNT 18X1 1/2 (NEEDLE) ×1 IMPLANT
NEEDLE HYPO 18GX1.5 SHARP (NEEDLE) ×2
NEEDLE HYPO 25GX1X1/2 BEV (NEEDLE) ×6 IMPLANT
NS IRRIG 1000ML POUR BTL (IV SOLUTION) ×3 IMPLANT
PACK SURGICAL SETUP 50X90 (CUSTOM PROCEDURE TRAY) ×3 IMPLANT
PAD ABD 8X10 STRL (GAUZE/BANDAGES/DRESSINGS) ×3 IMPLANT
PENCIL BUTTON HOLSTER BLD 10FT (ELECTRODE) ×3 IMPLANT
SPONGE LAP 18X18 X RAY DECT (DISPOSABLE) ×3 IMPLANT
SUT MNCRL AB 4-0 PS2 18 (SUTURE) ×3 IMPLANT
SUT VIC AB 3-0 SH 8-18 (SUTURE) ×6 IMPLANT
SYR BULB 3OZ (MISCELLANEOUS) ×3 IMPLANT
SYR CONTROL 10ML LL (SYRINGE) ×6 IMPLANT
TOWEL OR 17X24 6PK STRL BLUE (TOWEL DISPOSABLE) ×3 IMPLANT
TOWEL OR 17X26 10 PK STRL BLUE (TOWEL DISPOSABLE) ×3 IMPLANT
TUBE CONNECTING 12'X1/4 (SUCTIONS) ×1
TUBE CONNECTING 12X1/4 (SUCTIONS) ×2 IMPLANT
YANKAUER SUCT BULB TIP NO VENT (SUCTIONS) ×3 IMPLANT

## 2017-09-10 NOTE — Discharge Instructions (Signed)
[facility] - DISCHARGE INSTRUCTIONS TO PATIENT  Activity:  Driving - May drive in 2 or 3 days   Lifting - No lifting more than 15 pounds for 1 week, then no limit  Wound Care:   Leave bandage on for 2 days, then you may remove the bandage and shower.  Diet:  As tolerated  Follow up appointment:  Call Dr. Pollie Friar office [facility]) at [phone] for an appointment in 2 to 4 weeks.  Medications and dosages:  Resume your home medications.  You have a prescription for:  Vicodin  Call Dr. Lucia Gaskins or his office  [phone]) if you have:  Temperature greater than 100.4,  Persistent nausea and vomiting,  Severe uncontrolled pain,  Redness, tenderness, or signs of infection (pain, swelling, redness, odor or green/yellow discharge around the site),  Difficulty breathing, headache or visual disturbances,  Any other questions or concerns you may have after discharge.  In an emergency, call 911 or go to an Emergency Department at a nearby hospital.

## 2017-09-10 NOTE — Anesthesia Preprocedure Evaluation (Addendum)
Anesthesia Evaluation  Patient identified by MRN, date of birth, ID band Patient awake    Reviewed: Allergy & Precautions, NPO status , Patient's Chart, lab work & pertinent test results  History of Anesthesia Complications (+) PONV  Airway Mallampati: I       Dental no notable dental hx. (+) Teeth Intact   Pulmonary former smoker,    Pulmonary exam normal breath sounds clear to auscultation       Cardiovascular negative cardio ROS Normal cardiovascular exam Rhythm:Regular Rate:Normal     Neuro/Psych negative neurological ROS  negative psych ROS   GI/Hepatic negative GI ROS, Neg liver ROS,   Endo/Other  Hypothyroidism   Renal/GU negative Renal ROS     Musculoskeletal   Abdominal Normal abdominal exam  (+)   Peds  Hematology negative hematology ROS (+)   Anesthesia Other Findings   Reproductive/Obstetrics                             Anesthesia Physical Anesthesia Plan  ASA: II  Anesthesia Plan: General   Post-op Pain Management:  Regional for Post-op pain   Induction: Intravenous  PONV Risk Score and Plan: 4 or greater and Scopolamine patch - Pre-op, Dexamethasone, Ondansetron, Midazolam and Treatment may vary due to age or medical condition  Airway Management Planned: LMA  Additional Equipment:   Intra-op Plan:   Post-operative Plan:   Informed Consent: I have reviewed the patients History and Physical, chart, labs and discussed the procedure including the risks, benefits and alternatives for the proposed anesthesia with the patient or authorized representative who has indicated his/her understanding and acceptance.   Dental advisory given  Plan Discussed with: CRNA and Surgeon  Anesthesia Plan Comments:        Anesthesia Quick Evaluation

## 2017-09-10 NOTE — Interval H&P Note (Signed)
History and Physical Interval Note:  09/10/2017 11:54 AM  Barbara Ferguson  has presented today for surgery, with the diagnosis of LEFT BREAST CANCER  The various methods of treatment have been discussed with the patient and family. Husband, Timmothy Sours, and daughter, Herschel Senegal, at bedside.  After consideration of risks, benefits and other options for treatment, the patient has consented to  Procedure(s): LEFT BREAST LUMPECTOMY WITH RADIOACTIVE SEED AND LEFT AXILLARY SENTINEL LYMPH NODE BIOPSY (Left) as a surgical intervention .  The patient's history has been reviewed, patient examined, no change in status, stable for surgery.  I have reviewed the patient's chart and labs.  Questions were answered to the patient's satisfaction.     Shann Medal

## 2017-09-10 NOTE — Anesthesia Postprocedure Evaluation (Signed)
Anesthesia Post Note  Patient: Barbara Ferguson  Procedure(s) Performed: LEFT BREAST LUMPECTOMY WITH RADIOACTIVE SEED AND LEFT AXILLARY SENTINEL LYMPH NODE BIOPSY (Left Breast)     Patient location during evaluation: PACU Anesthesia Type: Regional and General Level of consciousness: awake Pain management: pain level controlled Vital Signs Assessment: post-procedure vital signs reviewed and stable Respiratory status: spontaneous breathing Cardiovascular status: stable Postop Assessment: no apparent nausea or vomiting Anesthetic complications: no    Last Vitals:  Vitals:   09/10/17 1430 09/10/17 1445  BP:  118/70  Pulse: 68 66  Resp: 15 15  Temp:  (!) 36.1 C  SpO2: 99% 100%    Last Pain:  Vitals:   09/10/17 1445  TempSrc:   PainSc: 3    Pain Goal: Patients Stated Pain Goal: 2 (09/10/17 1420)               Yaiza Palazzola JR,JOHN Mateo Flow

## 2017-09-10 NOTE — Op Note (Signed)
09/10/2017  1:51 PM  PATIENT:  Barbara Ferguson DOB: 16-May-1946 MRN: [id number]  PREOP DIAGNOSIS:   LEFT BREAST CANCER  POSTOP DIAGNOSIS:    Left Breast Cancer, 6 o'clock position (Tis, N0)  PROCEDURE:   Procedure(s): LEFT BREAST LUMPECTOMY WITH RADIOACTIVE SEED AND LEFT AXILLARY SENTINEL LYMPH NODE BIOPSY, Injection of peri areolar area of breast with methylene blue (1.0 cc), deep sentinel lymph node biopsy  SURGEON:   Alphonsa Overall, M.D.  ANESTHESIA:   general  Anesthesiologist: Lyn Hollingshead, MD; Albertha Ghee, MD CRNA: Inda Coke, CRNA; Orlie Dakin, CRNA  General  EBL:  minimal  ml  DRAINS:  none   LOCAL MEDICATIONS USED:   20 cc of 1/4% marcaine, left pectoral block by anesthesia  SPECIMEN:   Left breast lumpectomy (6 color paint), Inferior margin, left axillary SLNBx (counts 115, background 20, not blue)  COUNTS CORRECT:  YES  INDICATIONS FOR PROCEDURE:  Barbara Ferguson is a 71 y.o. (DOB: 1946-01-23) white female whose primary care physician is Leeroy Cha, MD and comes for left breast lumpectomy and left axillary sentinel lymph node biopsy.   She was [facility] on 08/04/2017 with Drs. Magrinat and Isidore Moos.  She had a mammogram on 07/19/2017 which showed a 12 mm cluster of calcifications in the left breast.  She underwent a biopsy which showed DCIS, suspicious for invasive carcinoma.   The options for breast cancer treatment have been discussed with the patient. She elected to proceed with lumpectomy and axillary sentinel lymph node.     The indications and potential complications of surgery were explained to the patient. Potential complications include, but are not limited to, bleeding, infection, the need for further surgery, and nerve injury.     She had a I131 seed placed on 09/08/2017 in her left breast at [facility].  The seed is in the 6 o'clock position of the left breast.   In the holding area, her left areola was  injected with 1 millicurie of Technitium Sulfur Colloid.  OPERATIVE NOTE:   The patient was taken to room # 2 at [facility] where she underwent a general anesthesia  supervised by Anesthesiologist: Lyn Hollingshead, MD; Albertha Ghee, MD CRNA: Inda Coke, CRNA; Orlie Dakin, CRNA. Her left breast and axilla were prepped with  ChloraPrep and sterilely draped.    A time-out and the surgical check list was reviewed.    I injected about 1.0 mL of 40% methylene blue around her left areola.   I turned attention to the cancer which was about at the 6 o'clock position of the left breast.   I used the Neoprobe to identify the I131 seed.  I made an inferior circumareolar incision.   I tried to excise an area around the tumor of at least 1 cm.    I excised this block of breast tissue approximately 5 cm by 6 cm  in diameter.       I painted the lumpectomy specimen with the 6 color paint kit and did a specimen mammogram which confirmed the mass, clip, and the seed were all in the right position in the specimen.  The specimen was sent to pathology who called back to confirm that they have the seed and the specimen.  The clips were close to the inferior margin, though the seed was in the middle of the specimen, on the specimen mammogram.   I took an additional inferior margin, which removed all the breast tissue  from the inferior nipple to the inframammary fold.  I took the dissection down to the chest wall.  This excised most of the tissue between the lower edge of the niplple and the inframammary fold.   If the inferior margin is still positive, there is no further tissue to remove in the lower breast.   I then started the left deep axillary sentinel lymph node biopsy. I made an incision in the left axilla.  I found a hot area at the junction of the breast and the pectoralis major muscle, deep in the axilla. I cut down and  identified a hot node that had counts of 115 and the background has 20 counts.  The lymph node was not aaaaaaaaaaaaaaaaablue. I checked her internal mammary nodes and supraclavicular nodes with the neoprobe and found no other hot area. The axillary node was then sent to pathology.    I then irrigated the wound with saline. I infiltrated approximately 20 mL of 1/4% Marcaine between the incisions. I placed 4 clips to mark biopsy cavity, at 12, 3, 6, and 9 o'clock.  I then closed all the wounds in layers using 3-0 Vicryl sutures for the deep layer. At the skin, I closed the incisions with a 4-0 Monocryl suture. The incisions were then painted with Dermabond.  She had gauze place over the wounds and placed in a breast binder.   The patient tolerated the procedure well, was transported to the recovery room in good condition. Sponge and needle count were correct at the end of the case.   Final pathology is pending.   Alphonsa Overall, MD, [facility] Surgery Pager: [phone] Office phone:  [phone]

## 2017-09-10 NOTE — Transfer of Care (Signed)
Immediate Anesthesia Transfer of Care Note  Patient: Barbara Ferguson  Procedure(s) Performed: LEFT BREAST LUMPECTOMY WITH RADIOACTIVE SEED AND LEFT AXILLARY SENTINEL LYMPH NODE BIOPSY (Left Breast)  Patient Location: PACU  Anesthesia Type:General  Level of Consciousness: awake  Airway & Oxygen Therapy: Patient Spontanous Breathing and Patient connected to face mask oxygen  Post-op Assessment: Report given to RN and Post -op Vital signs reviewed and stable  Post vital signs: Reviewed and stable  Last Vitals:  Vitals:   09/10/17 1145 09/10/17 1354  BP: (!) 77/37 109/69  Pulse: 61 70  Resp: 14 14  Temp:    SpO2: 100% 100%    Last Pain:  Vitals:   09/10/17 0940  TempSrc: Oral      Patients Stated Pain Goal: 0 (47/09/29 5747)  Complications: No apparent anesthesia complications

## 2017-09-10 NOTE — Anesthesia Procedure Notes (Addendum)
Anesthesia Regional Block: Pectoralis block   Pre-Anesthetic Checklist: ,, timeout performed, Correct Patient, Correct Site, Correct Laterality, Correct Procedure, Correct Position, site marked, Risks and benefits discussed,  Surgical consent,  Pre-op evaluation,  At surgeon's request and post-op pain management  Laterality: Upper and Left  Prep: chloraprep       Needles:  Injection technique: Single-shot  Needle Type: Echogenic Stimulator Needle     Needle Length: 10cm  Needle Gauge: 21   Needle insertion depth: 2 cm   Additional Needles:   Procedures:,,,, ultrasound used (permanent image in chart),,,,  Narrative:  Start time: 09/10/2017 11:10 AM End time: 09/10/2017 11:20 AM Injection made incrementally with aspirations every 5 mL.  Performed by: Personally  Anesthesiologist: Lyn Hollingshead, MD

## 2017-09-10 NOTE — Anesthesia Procedure Notes (Signed)
Procedure Name: LMA Insertion Date/Time: 09/10/2017 12:17 PM Performed by: Orlie Dakin, CRNA Pre-anesthesia Checklist: Patient identified, Emergency Drugs available, Suction available, Patient being monitored and Timeout performed Patient Re-evaluated:Patient Re-evaluated prior to induction Oxygen Delivery Method: Circle system utilized Preoxygenation: Pre-oxygenation with 100% oxygen Induction Type: IV induction Ventilation: Mask ventilation without difficulty LMA: LMA inserted LMA Size: 4.0 Tube type: Oral Number of attempts: 1 Placement Confirmation: positive ETCO2 and breath sounds checked- equal and bilateral Tube secured with: Tape Dental Injury: Teeth and Oropharynx as per pre-operative assessment

## 2017-09-11 ENCOUNTER — Encounter (HOSPITAL_COMMUNITY): Payer: Self-pay | Admitting: Surgery

## 2017-09-22 NOTE — Progress Notes (Signed)
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 Breast, lumpectomy, Left - DUCTAL CARCINOMA IN SITU WITH CALCIFICATIONS, INTERMEDIATE GRADE, SPANNING 1.0 CM. - DUCTAL CARCINOMA IN SITU IS BROADLY PRESENT AT THE INFERIOR MARGIN OF SPECIMEN #1. - SEE ONCOLOGY TABLE BELOW. 2. Lymph node, sentinel, biopsy, Left axillary - THERE IS NO EVIDENCE OF CARCINOMA IN 1 OF 1 LYMPH NODE (0/1). 3. Lymph node, sentinel, biopsy, Left axillary - THERE IS NO EVIDENCE OF CARCINOMA IN 1 OF 1 LYMPH NODE (0/1). 4. Lymph node, sentinel, biopsy, Left axillary - THERE IS NO EVIDENCE OF CARCINOMA IN 1 OF 1 LYMPH NODE (0/1). 5. Lymph node, biopsy, Left axillary - THERE IS NO EVIDENCE OF CARCINOMA IN 1 OF 1 LYMPH NODE (0/1). 6. Breast, lumpectomy, Left additional inferior margin - BENIGN FIBROADIPOSE TISSUE. - THERE IS NO EVIDENCE OF MALIGNANCY. - SEE COMMENT  Did patient present with symptoms or was this found on screening mammography?: It was found on a screening mammogram.   Past/Anticipated interventions by surgeon, if any: 09/10/17 PROCEDURE:   Procedure(s): LEFT BREAST LUMPECTOMY WITH RADIOACTIVE SEED AND LEFT AXILLARY SENTINEL LYMPH NODE BIOPSY, Injection of peri areolar area of breast with methylene blue (1.0 cc), deep sentinel lymph node biopsy SURGEON:   Alphonsa Overall, M.D.  Past/Anticipated interventions by medical oncology, if any:  Breast Clinic 08/04/17 Dr. Jana Hakim; 1) breast conserving surgery with sentinel lymph node sampling planned 2) adjuvant radiation to follow 3) consider antiestrogens.   Lymphedema issues, if any:  She denies. She has good arm mobility.   Pain issues, if any: She denies.   SAFETY ISSUES: --Prior radiation? reports an iodine "beam"  treatment to her thyroid region as a teenager. --Pacemaker/ICD? No --Possible current pregnancy? No --Is the patient on methotrexate? No  Current Complaints / other details:    BP 124/78   Pulse 72   Temp 97.7 F (36.5 C)   Ht 5\' 2"  (1.575 m)   Wt 123 lb 12.8 oz (56.2 kg)   SpO2 100% Comment: room air  BMI 22.64 kg/m    Wt Readings from Last 3 Encounters:  09/29/17 123 lb 12.8 oz (56.2 kg)  09/10/17 126 lb 13 oz (57.5 kg)  09/06/17 126 lb 12.8 oz (57.5 kg)      Kaden Daughdrill, Stephani Police, RN 09/22/2017,11:02 AM

## 2017-09-29 ENCOUNTER — Ambulatory Visit
Admission: RE | Admit: 2017-09-29 | Discharge: 2017-09-29 | Disposition: A | Discharge status: Home / Self Care | Payer: Medicare Other | Source: Ambulatory Visit | Attending: Radiation Oncology | Admitting: Radiation Oncology

## 2017-09-29 ENCOUNTER — Encounter: Payer: Self-pay | Admitting: Radiation Oncology

## 2017-09-29 VITALS — BP 124/78 | HR 72 | Temp 97.7°F | Ht 62.0 in | Wt 123.8 lb

## 2017-09-29 DIAGNOSIS — C50312 Malignant neoplasm of lower-inner quadrant of left female breast: Secondary | ICD-10-CM

## 2017-09-29 DIAGNOSIS — Z87891 Personal history of nicotine dependence: Secondary | ICD-10-CM | POA: Diagnosis not present

## 2017-09-29 DIAGNOSIS — Z803 Family history of malignant neoplasm of breast: Secondary | ICD-10-CM | POA: Diagnosis not present

## 2017-09-29 DIAGNOSIS — Z510 Encounter for antineoplastic radiation therapy: Secondary | ICD-10-CM | POA: Diagnosis not present

## 2017-09-29 DIAGNOSIS — M1990 Unspecified osteoarthritis, unspecified site: Secondary | ICD-10-CM | POA: Diagnosis not present

## 2017-09-29 DIAGNOSIS — Z170 Estrogen receptor positive status [ER+]: Principal | ICD-10-CM

## 2017-09-29 DIAGNOSIS — Z79891 Long term (current) use of opiate analgesic: Secondary | ICD-10-CM | POA: Diagnosis not present

## 2017-09-29 DIAGNOSIS — Z79899 Other long term (current) drug therapy: Secondary | ICD-10-CM | POA: Diagnosis not present

## 2017-09-29 DIAGNOSIS — E039 Hypothyroidism, unspecified: Secondary | ICD-10-CM | POA: Diagnosis not present

## 2017-09-29 DIAGNOSIS — Z96653 Presence of artificial knee joint, bilateral: Secondary | ICD-10-CM | POA: Diagnosis not present

## 2017-09-29 DIAGNOSIS — R05 Cough: Secondary | ICD-10-CM | POA: Diagnosis not present

## 2017-09-29 DIAGNOSIS — Z87442 Personal history of urinary calculi: Secondary | ICD-10-CM | POA: Diagnosis not present

## 2017-09-29 DIAGNOSIS — Z9889 Other specified postprocedural states: Secondary | ICD-10-CM | POA: Diagnosis not present

## 2017-09-29 DIAGNOSIS — R200 Anesthesia of skin: Secondary | ICD-10-CM | POA: Diagnosis not present

## 2017-09-29 NOTE — Progress Notes (Signed)
Radiation Oncology         [phone] ________________________________  Name: Barbara Ferguson MRN: [id number]  Date: 09/29/2017  DOB: [birth year]  Follow-Up Visit Note  Outpatient  CC: Leeroy Cha, MD  Magrinat, Virgie Dad, MD  Diagnosis:      ICD-10-CM   1. 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 The patient was originally seen in breast clinic for consultation on 08/04/2017.   Since consultation, she underwent left breast lumpectomy and left axillary sentinel node biopsy on 09/10/2017 with Dr. Lucia Gaskins. Final pathology showed DCIS with calcifications in the left breast, spanning 1.0 cm, and the margins were ultimately found to be clear up to 0.2 cm. Intermediate grade, ER 100% / PR 90%. 0 out of 4 left axillary lymph nodes biopsied showed evidence of malignancy.  She is doing well overall and is unaccompanied today. She denies any lymphedema issues. She has good arm mobility. She reports initial shooting pains in the breast after surgery but these have since resolved on their own.   ALLERGIES:  has No Known Allergies.  Meds: Current Outpatient Medications  Medication Sig Dispense Refill  . cholecalciferol (VITAMIN D) 1000 units tablet Take 1,000 Units by mouth daily.    Marland Kitchen glucosamine-chondroitin 500-400 MG tablet Take 1 tablet by mouth daily.     Marland Kitchen levothyroxine (SYNTHROID, LEVOTHROID) 75 MCG tablet Take 75 mcg by mouth daily before breakfast.    . Multiple Vitamins-Minerals (MULTIVITAMIN & MINERAL PO) Take 1 tablet by mouth daily.    Marland Kitchen HYDROcodone-acetaminophen (NORCO/VICODIN) 5-325 MG tablet Take 1-2 tablets by mouth every 6 (six) hours as needed for moderate pain. (Patient not taking:  Reported on 09/29/2017) 15 tablet 0   No current facility-administered medications for this encounter.     Physical Findings:  height is '5\' 2"'  (1.575 m) and weight is 123 lb 12.8 oz (56.2 kg). Her temperature is 97.7 F (36.5 C). Her blood pressure is 124/78 and her pulse is 72. Her oxygen saturation is 100%. .     General: Alert and oriented, in no acute distress HEENT: Head is normocephalic. Extraocular movements are intact. Musculoskeletal: Good range of motion in her left shoulder and no edema in her wrist. Psychiatric: Judgment and insight are intact. Affect is appropriate. Breast exam reveals she has a lumpectomy scar along the inferior aspect of the left areola, healing well. She has a little bit of erythema along the lateral aspect of the lumpectomy scar. No excessive warmth. The axillary scar is healing well on the left. Skin : as above per breast exam  Lab Findings: Lab Results  Component Value Date   WBC 7.6 09/06/2017   HGB 12.6 09/06/2017   HCT 38.1 09/06/2017   MCV 90.9 09/06/2017   PLT 257 09/06/2017    Radiographic Findings: Nm Sentinel Node Inj-no Rpt (breast)  Result Date: 09/10/2017 Sulfur colloid was injected by the nuclear medicine technologist for melanoma sentinel node.   Mm Breast Surgical Specimen  Result Date: 09/10/2017 CLINICAL DATA:  Left breast lumpectomy for biopsy-proven DCIS with possible focal microscopic invasion involving the lower inner quadrant of the left breast. Radioactive seed localization was performed 09/08/2017. EXAM: SPECIMEN RADIOGRAPH OF THE LEFT BREAST COMPARISON:  Previous  exam(s). FINDINGS: Status post excision of the left breast. The radioactive seed, the coil shaped tissue marker clip, the heart shaped tissue marker clip and calcifications are present, completely intact, and are marked for pathology. This was discussed directly with Dr. Lucia Gaskins in the operating room at the time of interpretation on 09/10/2017 at 1:19 p.m.  IMPRESSION: Specimen radiograph of the left breast. Electronically Signed   By: Evangeline Dakin M.D.   On: 09/10/2017 14:44   Mm Lt Radioactive Seed Loc Mammo Guide  Result Date: 09/08/2017 CLINICAL DATA:  71 year old female for radioactive seed localization of left breast DCIS prior to lumpectomy. EXAM: MAMMOGRAPHIC GUIDED RADIOACTIVE SEED LOCALIZATION OF THE LEFT BREAST COMPARISON:  Previous exam(s). FINDINGS: Patient presents for radioactive seed localization prior to left lumpectomy. I met with the patient and we discussed the procedure of seed localization including benefits and alternatives. We discussed the high likelihood of a successful procedure. We discussed the risks of the procedure including infection, bleeding, tissue injury and further surgery. We discussed the low dose of radioactivity involved in the procedure. Informed, written consent was given. The usual time-out protocol was performed immediately prior to the procedure. Using mammographic guidance, sterile technique, 1% lidocaine and an I-125 radioactive seed, the remaining calcifications were localized using a lateral approach. The follow-up mammogram images confirm the seed in the expected location and were marked for Dr. Lucia Gaskins. Follow-up survey of the patient confirms presence of the radioactive seed, which is located along the anterior aspect of remaining calcifications. Calcifications and coil shaped biopsy clip are noted up to 1 cm posterior to the seed. Order number of I-125 seed:  [id number]. Total activity:  5.427 millicurie  Reference Date: 08/20/2017 The patient tolerated the procedure well and was released from the [facility]. She was given instructions regarding seed removal. IMPRESSION: Radioactive seed localization left breast. No apparent complications. Please note that the seed was placed along the anterior aspect of remaining calcifications. Calcifications and coil shaped clip are noted up to 1 cm posterior to the  seed. Electronically Signed   By: Margarette Canada M.D.   On: 09/08/2017 15:03    Impression/Plan:Left Breast LIQ DCIS  ER positive / PR positive  I spoke with radiology about the utility of a post operative mammogram before starting treatment. Radiology felt that this would not be necessary.  We discussed adjuvant radiotherapy today.  I recommend radiotherapy to the left breast in order to reduce risk of locoregional recurrence.  The risks, benefits and side effects of this treatment were discussed in detail.  She understands that radiotherapy is associated with skin irritation and fatigue in the acute setting. Late effects can include cosmetic changes and rare injury to internal organs.   She is enthusiastic about proceeding with treatment. A consent form has been signed and placed in her chart.  A total of 3 medically necessary complex treatment devices will be fabricated and supervised by me: 2 fields with MLCs for custom blocks to protect heart, and lungs;  and, a Vac-lok. MORE COMPLEX DEVICES MAY BE MADE IN DOSIMETRY FOR FIELD IN FIELD BEAMS FOR DOSE HOMOGENEITY.  I have requested : 3D Simulation which is medically necessary to give adequate dose to at risk tissues while sparing lungs and heart.  I have requested a DVH of the following structures: lungs, heart, left lumpectomy cavity.    The patient will receive 40.05 Gy in 15 fractions to the left breast with 2 fields.  This will be followed by a boost.  Simulation ordered for early January.  I spent at least 20 minutes face to face with the patient and more than 50% of that time was spent in counseling and/or coordination of care. _____________________________________   Eppie Gibson, MD  This document serves as a record of services personally performed by Eppie Gibson, MD. It was created on her behalf by Arlyce Harman, a trained medical scribe. The creation of this record is based on the scribe's personal observations and the provider's  statements to them. This document has been checked and approved by the attending provider.

## 2017-09-30 ENCOUNTER — Ambulatory Visit: Payer: Medicare Other | Admitting: Oncology

## 2017-09-30 ENCOUNTER — Encounter: Payer: Self-pay | Admitting: Radiation Oncology

## 2017-10-01 ENCOUNTER — Ambulatory Visit: Payer: Medicare Other | Admitting: Oncology

## 2017-10-11 ENCOUNTER — Encounter: Payer: Self-pay | Admitting: Physical Therapy

## 2017-10-11 ENCOUNTER — Ambulatory Visit: Payer: Medicare Other | Attending: Surgery | Admitting: Physical Therapy

## 2017-10-11 ENCOUNTER — Ambulatory Visit
Admission: RE | Admit: 2017-10-11 | Discharge: 2017-10-11 | Disposition: A | Discharge status: Home / Self Care | Payer: Medicare Other | Source: Ambulatory Visit | Attending: Radiation Oncology | Admitting: Radiation Oncology

## 2017-10-11 DIAGNOSIS — Z170 Estrogen receptor positive status [ER+]: Principal | ICD-10-CM

## 2017-10-11 DIAGNOSIS — D0512 Intraductal carcinoma in situ of left breast: Secondary | ICD-10-CM

## 2017-10-11 DIAGNOSIS — R293 Abnormal posture: Secondary | ICD-10-CM

## 2017-10-11 DIAGNOSIS — M25612 Stiffness of left shoulder, not elsewhere classified: Secondary | ICD-10-CM | POA: Diagnosis present

## 2017-10-11 DIAGNOSIS — Z483 Aftercare following surgery for neoplasm: Secondary | ICD-10-CM | POA: Insufficient documentation

## 2017-10-11 DIAGNOSIS — C50312 Malignant neoplasm of lower-inner quadrant of left female breast: Secondary | ICD-10-CM

## 2017-10-11 DIAGNOSIS — Z510 Encounter for antineoplastic radiation therapy: Secondary | ICD-10-CM | POA: Diagnosis not present

## 2017-10-11 NOTE — Therapy (Signed)
[place], [place], [address] Phone: [phone]   Fax:  [phone]  Physical Therapy Treatment  Patient Details  Name: Barbara Ferguson MRN: [id number] Date of Birth: [birth year] Referring Provider: Dr. Alphonsa Overall   Encounter Date: 10/11/2017  PT End of Session - 10/11/17 1048    Visit Number  2    Number of Visits  2    PT Start Time  1000    PT Stop Time  1043    PT Time Calculation (min)  43 min    Activity Tolerance  Patient tolerated treatment well    Behavior During Therapy  [facility] for tasks assessed/performed       Past Medical History:  Diagnosis Date  . Arthritis   . 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 Family history of breast cancer   . Family history of colon cancer   . History of colon polyps    benign  . History of kidney stones   . Hypothyroidism    takes SYnthroid daily  . Joint pain   . Joint swelling   . PONV (postoperative nausea and vomiting)     Past Surgical History:  Procedure Laterality Date  . BREAST LUMPECTOMY WITH RADIOACTIVE SEED AND SENTINEL LYMPH NODE BIOPSY Left 09/10/2017   Procedure: LEFT BREAST LUMPECTOMY WITH RADIOACTIVE SEED AND LEFT AXILLARY SENTINEL LYMPH NODE BIOPSY;  Surgeon: Alphonsa Overall, MD;  Location: [facility];  Service: General;  Laterality: Left;  . COLONOSCOPY WITH PROPOFOL N/A 09/04/2014   Procedure: COLONOSCOPY WITH PROPOFOL;  Surgeon: Garlan Fair, MD;  [facility];  Service: Endoscopy;  Laterality: N/A;  . DILATION AND CURETTAGE OF UTERUS    . 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 TONSILLECTOMY    . TOTAL KNEE ARTHROPLASTY Left 01/20/2016   Procedure: TOTAL KNEE ARTHROPLASTY;  Surgeon: Vickey Huger, MD;  Location: [facility];  Service: Orthopedics;  Laterality: Left;  . TOTAL KNEE ARTHROPLASTY Right 06/01/2016   Procedure: TOTAL KNEE ARTHROPLASTY;  Surgeon: Vickey Huger, MD;  Location: [facility];  Service: Orthopedics;   Laterality: Right;    There were no vitals filed for this visit.  Subjective Assessment - 10/11/17 1007    Subjective  Patient underwent a left lumpectomy with 4 lymph nodes removed and all were negative on 21/7/18. There was no invasive cancer. She got an infection on 10/04/17 and took antibiotics until yesterday 10/10/17. She has simulation this afternoon for radiation.     Pertinent History  Patient was diagnosed on 07/19/17 with left grade 2 invasive ductal carcinoma breast cancer. It measures 1.2 cm and is ER/PR positive. Patient underwent a left lumpectomy with 4 lymph nodes removed and all were negative. There was no invasive cancer. She got an infection on 10/04/17 and took antibiotics until yesterday 10/10/17. She has simulation this afternoon for radiation.     Patient Stated Goals  Reduce lymphedema risk and learn post op shoulder ROM HEP    Currently in Pain?  Yes    Pain Score  4     Pain Location  Back    Pain Orientation  Left    Pain Descriptors / Indicators  Burning    Pain Type  Acute pain    Pain Onset  1 to 4 weeks ago    Pain Frequency  Intermittent    Aggravating Factors   Sitting    Pain Relieving Factors  Standing    Multiple Pain Sites  No         [facility] PT Assessment - 10/11/17 0001      Assessment   Medical Diagnosis  s/p left lumpectomy and SLNB    Referring Provider  Dr. Alphonsa Overall    Onset Date/Surgical Date  09/10/17    Hand Dominance  Right    Prior Therapy  none      Precautions   Precautions  Other (comment)    Precaution Comments  Left arm lymphedema risk      Restrictions   Weight Bearing Restrictions  No      Balance Screen   Has the patient fallen in the past 6 months  No    Has the patient had a decrease in activity level because of a fear of falling?   No    Is the patient reluctant to leave their home because of a fear of falling?   No      Home Film/video editor residence    Living Arrangements   Spouse/significant other    Available Help at Discharge  Family      Prior Function   Level of Independence  Independent    Vocation  Retired    Biomedical scientist  retired Copywriter, advertising    Leisure  She does yoga 5x/week, tennis 1x/week, and pickleball      Cognition   Overall Cognitive Status  Within Functional Limits for tasks assessed      Posture/Postural Control   Posture/Postural Control  Postural limitations    Postural Limitations  Rounded Shoulders;Forward head      ROM / Strength   AROM / PROM / Strength  AROM      AROM   AROM Assessment Site  Shoulder    Left Shoulder Extension  73 Degrees    Left Shoulder Flexion  164 Degrees    Left Shoulder ABduction  175 Degrees    Left Shoulder Internal Rotation  70 Degrees    Left Shoulder External Rotation  48 Degrees      Palpation   Palpation comment  Incision sites appear to be well healed. redness still present around left lateral breast from infection.        LYMPHEDEMA/ONCOLOGY QUESTIONNAIRE - 10/11/17 1017      Type   Cancer Type  s/p left lumpectomy      Surgeries   Lumpectomy Date  09/10/17    Sentinel Lymph Node Biopsy Date  09/10/17    Number Lymph Nodes Removed  4      Treatment   Active Chemotherapy Treatment  No    Active Radiation Treatment  No    Past Radiation Treatment  No    Current Hormone Treatment  No    Past Hormone Therapy  No      What other symptoms do you have   Are you Having Heaviness or Tightness  No    Are you having Pain  No    Are you having pitting edema  No    Is it Hard or Difficult finding clothes that fit  No    Do you have infections  Yes    Comments  last week    Is there Decreased scar mobility  Yes    Stemmer Sign  No      Lymphedema Assessments   Lymphedema Assessments  Upper extremities      Right Upper Extremity Lymphedema   10 cm Proximal to Olecranon Process  26.5 cm  Olecranon Process  22 cm    10 cm Proximal to Ulnar Styloid Process  19 cm     Just Proximal to Ulnar Styloid Process  13.8 cm    Across Hand at [employer]  17.7 cm    At [place] of 2nd Digit  5.7 cm      Left Upper Extremity Lymphedema   10 cm Proximal to Olecranon Process  25.7 cm    Olecranon Process  21.7 cm    10 cm Proximal to Ulnar Styloid Process  18.2 cm    Just Proximal to Ulnar Styloid Process  13.7 cm    Across Hand at [employer]  17.3 cm    At [place] of 2nd Digit  5.8 cm        Quick Dash - 10/11/17 0001    Open a tight or new jar  Mild difficulty    Do heavy household chores (wash walls, wash floors)  Mild difficulty    Carry a shopping bag or briefcase  No difficulty    Wash your back  No difficulty    Use a knife to cut food  No difficulty    Recreational activities in which you take some force or impact through your arm, shoulder, or hand (golf, hammering, tennis)  No difficulty    During the past week, to what extent has your arm, shoulder or hand problem interfered with your normal social activities with family, friends, neighbors, or groups?  Slightly    During the past week, to what extent has your arm, shoulder or hand problem limited your work or other regular daily activities  Not at all    Arm, shoulder, or hand pain.  Mild    Tingling (pins and needles) in your arm, shoulder, or hand  Mild    Difficulty Sleeping  No difficulty    DASH Score  11.36 %                    PT Education - 10/11/17 1047    Education provided  Yes    Education Details  Issued ABC class packet and educated pt verbally and with handout on lymphedema risk reduction practices.    Person(s) Educated  Patient    Methods  Explanation;Handout    Comprehension  Verbalized understanding           Breast Clinic Goals - 08/04/17 1249      Patient will be able to verbalize understanding of pertinent lymphedema risk reduction practices relevant to her diagnosis specifically related to skin care.   Time  1    Period  Days    Status  Achieved       Patient will be able to return demonstrate and/or verbalize understanding of the post-op home exercise program related to regaining shoulder range of motion.   Time  1    Period  Days    Status  Achieved      Patient will be able to verbalize understanding of the importance of attending the postoperative After Breast Cancer Class for further lymphedema risk reduction education and therapeutic exercise.   Time  1    Period  Days    Status  Achieved       Long Term Clinic Goals - 10/11/17 1052      CC Long Term Goal  #1   Title  Patient will have returned to baseline with shoulder function and ROM.    Time  8  Period  Weeks    Status  Achieved         Plan - 10/11/17 1048    Clinical Impression Statement  Patient is dong very well after her left lumpectomy and SLNB on 09/10/17. She had 4 negative nodes removed and has regained the shoulder ROM she demonstrated at baseline at time of diagnosis. She recently had a breast infection and completed antibiotics yesterday for that. She will begin radiation this week. She has a new sciatic type pain in her left low back but is going for treatment at a [facility] today for that. Otherwise she is doing quite well and has no need for PT at this time.    PT Treatment/Interventions  ADLs/Self Care Home Management;Therapeutic exercise;Patient/family education    PT Next Visit Plan  D/C as she has regained full function since surgery and hsa no signs of lympehdema.    PT Home Exercise Plan  Post op shoulder ROM HEP    Consulted and Agree with Plan of Care  Patient       Patient will benefit from skilled therapeutic intervention in order to improve the following deficits and impairments:     Visit Diagnosis: Ductal carcinoma in situ (DCIS) of left breast  Abnormal posture  Stiffness of left shoulder, not elsewhere classified  Aftercare following surgery for neoplasm     Problem List Patient Active Problem List   Diagnosis Date Noted   . Genetic testing 08/12/2017  . Family history of breast cancer   . Family history of colon cancer   . Hypothyroidism 08/04/2017  . Vitamin D deficiency 08/04/2017  . Malignant neoplasm of lower-inner quadrant of left breast in female, estrogen receptor positive ([place]) 08/04/2017  . 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   Remaining deficits: None   Education / Equipment: Education about lymphedema risk reduction. Plan: Patient agrees to discharge.  Patient goals were met. Patient is being discharged due to meeting the stated rehab goals.  ?????    Annia Friendly, [place] 10/11/17 10:54 AM   [place], [place], [address] Phone: [phone]   Fax:  [phone]  Name: Barbara Ferguson MRN: [id number] Date of Birth: 02/25/1946

## 2017-10-11 NOTE — Progress Notes (Signed)
Radiation Oncology         [phone] ________________________________  Name: Barbara Ferguson MRN: [id number]  Date: 10/11/2017  DOB: 10/11/1945  SIMULATION AND TREATMENT PLANNING NOTE  // Special treatment procedure  Outpatient  DIAGNOSIS:     ICD-10-CM   1. Malignant neoplasm of lower-inner quadrant of left breast in female, estrogen receptor positive ([place]) C50.312    Z17.0     NARRATIVE:  The patient was brought to the [facility].  Identity was confirmed.  All relevant records and images related to the planned course of therapy were reviewed.  The patient freely provided informed written consent to proceed with treatment after reviewing the details related to the planned course of therapy. The consent form was witnessed and verified by the simulation staff.    Then, the patient was set-up in a stable reproducible supine position for radiation therapy with her ipsilateral arm over her head, and her upper body secured in a custom-made Vac-lok device.  CT images were obtained.  Surface markings were placed.  The CT images were loaded into the planning software.    Special treatment procedure:  Special treatment procedure was performed today due to the extra time and effort required by myself to plan and prepare this patient for deep inspiration breath hold technique.  I have determined cardiac sparing to be of benefit to this patient to prevent long term cardiac damage due to radiation of the heart.  Bellows were placed on the patient's abdomen. To facilitate cardiac sparing, the patient was coached by the radiation therapists on breath hold techniques and breathing practice was performed. Practice waveforms were obtained. The patient was then scanned while maintaining breath hold in the treatment position.  This image was then transferred over to the imaging specialist. The imaging specialist then created a fusion of the free breathing and breath hold scans using the chest wall  as the stable structure. I personally reviewed the fusion in axial, coronal and sagittal image planes.  Excellent cardiac sparing was obtained.  I felt the patient is an appropriate candidate for breath hold and the patient will be treated as such.  The image fusion was then reviewed with the patient to reinforce the necessity of reproducible breath hold.  TREATMENT PLANNING NOTE: Treatment planning then occurred.  The radiation prescription was entered and confirmed.     A total of 3 medically necessary complex treatment devices were fabricated and supervised by me: 2 fields with MLCs for custom blocks to protect heart, and lungs;  and, a Vac-lok. MORE COMPLEX DEVICES MAY BE MADE IN DOSIMETRY FOR FIELD IN FIELD BEAMS FOR DOSE HOMOGENEITY.  I have requested : 3D Simulation which is medically necessary to give adequate dose to at risk tissues while sparing lungs and heart.  I have requested a DVH of the following structures: lungs, heart, left lumpectomy cavity.    The patient will receive 40.05 Gy in 15 fractions to the left breast with 2 tangential fields.  This will be followed by a boost.  Optical Surface Tracking Plan:  Since intensity modulated radiotherapy (IMRT) and 3D conformal radiation treatment methods are predicated on accurate and precise positioning for treatment, intrafraction motion monitoring is medically necessary to ensure accurate and safe treatment delivery. The ability to quantify intrafraction motion without excessive ionizing radiation dose can only be performed with optical surface tracking. Accordingly, surface imaging offers the opportunity to obtain 3D measurements of patient position throughout IMRT and 3D treatments without excessive radiation  exposure. I am ordering optical surface tracking for this patient's upcoming course of radiotherapy.  ________________________________   Reference:  Ursula Alert, J, et al. Surface imaging-based analysis of  intrafraction motion for breast radiotherapy patients.Journal of [place], n. 6, nov. 2014. ISSN [id number].  Available at: <http://www.jacmp.org/index.php/jacmp/article/view/4957>.    -----------------------------------  Eppie Gibson, MD

## 2017-10-12 ENCOUNTER — Telehealth: Payer: Self-pay | Admitting: *Deleted

## 2017-10-12 DIAGNOSIS — Z510 Encounter for antineoplastic radiation therapy: Secondary | ICD-10-CM | POA: Diagnosis not present

## 2017-10-12 NOTE — Telephone Encounter (Signed)
This RN returned call to pt per her VM stating her radiation has been delay by 1 week " so do I need to reschedule my appointment with Dr Jana Hakim for 2/11?"  Noted per chart review current last day of radiation scheduled for 2/15.  Per return call obtained identified VM - message left informing pt current appointments are appropriate for discussion of plan of care and need for any additional medications per completion of radiation.

## 2017-10-18 ENCOUNTER — Ambulatory Visit: Payer: Medicare Other

## 2017-10-19 ENCOUNTER — Ambulatory Visit: Payer: Medicare Other

## 2017-10-20 ENCOUNTER — Ambulatory Visit: Payer: Medicare Other

## 2017-10-20 NOTE — Progress Notes (Signed)
[facility], [place] [address] Phone: [phone] Subjective:     CC: Left leg pain  EVO:[id number]  Barbara Ferguson is a 72 y.o. female coming in with complaint of left leg pain.  Recently diagnosed with malignant neoplasm of the breast.  Did have a left breast lumpectomy September 10, 2017.  Also did have a left and right knee replacement done June 01, 2016. Doesn't remember an injury. Says it eased into a level 7. She says she feels better when she's constantly moving.   Onset- Chronic Character- Achy Location- thigh moves to waist and can go down to her ankle. Sitting- hip lying and standing- calf Relieving factors- Heat, position and movement Aggravating factor- Standing Severity 6 out of 10 Denies any fevers chills or any abnormal weight loss patient is going to be undergoing radiation in the near future       Past Medical History:  Diagnosis Date  . Arthritis   . 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 Family history of breast cancer   . Family history of colon cancer   . History of colon polyps    benign  . History of kidney stones   . Hypothyroidism    takes SYnthroid daily  . Joint pain   . Joint swelling   . PONV (postoperative nausea and vomiting)    Past Surgical History:  Procedure Laterality Date  . BREAST LUMPECTOMY WITH RADIOACTIVE SEED AND SENTINEL LYMPH NODE BIOPSY Left 09/10/2017   Procedure: LEFT BREAST LUMPECTOMY WITH RADIOACTIVE SEED AND LEFT AXILLARY SENTINEL LYMPH NODE BIOPSY;  Surgeon: Alphonsa Overall, MD;  Location: [facility];  Service: General;  Laterality: Left;  . COLONOSCOPY WITH PROPOFOL N/A 09/04/2014   Procedure: COLONOSCOPY WITH PROPOFOL;  Surgeon: Garlan Fair, MD;  [facility];  Service: Endoscopy;  Laterality: N/A;  . DILATION AND CURETTAGE OF UTERUS    . 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 TONSILLECTOMY    . TOTAL KNEE ARTHROPLASTY Left 01/20/2016   Procedure:  TOTAL KNEE ARTHROPLASTY;  Surgeon: Vickey Huger, MD;  Location: [facility];  Service: Orthopedics;  Laterality: Left;  . TOTAL KNEE ARTHROPLASTY Right 06/01/2016   Procedure: TOTAL KNEE ARTHROPLASTY;  Surgeon: Vickey Huger, MD;  Location: [facility];  Service: Orthopedics;  Laterality: Right;   Social History   Socioeconomic History  . Marital status: Married    Spouse name: None  . Number of children: None  . Years of education: None  . Highest education level: None  Social Needs  . Financial resource strain: None  . Food insecurity - worry: None  . Food insecurity - inability: None  . Transportation needs - medical: None  . Transportation needs - non-medical: None  Occupational History  . None  Tobacco Use  . Smoking status: Former Smoker    Last attempt to quit: 08/04/1966    Years since quitting: 51.2  . Smokeless tobacco: Never Used  . Tobacco comment: smoked for 1 year  Substance and Sexual Activity  . Alcohol use: Yes    Comment: rare alcohol use  . Drug use: No  . Sexual activity: None  Other Topics Concern  . None  Social History Narrative  . None   No Known Allergies Family History  Problem Relation Age of Onset  . Breast cancer Sister 79       had Genetic testing in ~2016, reportedly negative  . Stroke Mother   . Colon  cancer Maternal Grandmother 60  . Heart disease Maternal Grandfather      Past medical history, social, surgical and family history all reviewed in electronic medical record.  No pertanent information unless stated regarding to the chief complaint.   Review of Systems:Review of systems updated and as accurate as of 10/21/17  No headache, visual changes, nausea, vomiting, diarrhea, constipation, dizziness, abdominal pain, skin rash, fevers, chills, night sweats, weight loss, swollen lymph nodes, body aches, joint swelling,  chest pain, shortness of breath, mood changes.  Positive muscle aches  Objective  Blood pressure 138/84, pulse (!) 56, height 5'  2" (1.575 m), weight 124 lb (56.2 kg), SpO2 93 %. Systems examined below as of 10/21/17   General: No apparent distress alert and oriented x3 mood and affect normal, dressed appropriately.  HEENT: Pupils equal, extraocular movements intact  Respiratory: Patient's speak in full sentences and does not appear short of breath  Cardiovascular: No lower extremity edema, non tender, no erythema  Skin: Warm dry intact with no signs of infection or rash on extremities or on axial skeleton.  Abdomen: Soft nontender  Neuro: Cranial nerves II through XII are intact, neurovascularly intact in all extremities with 2+ DTRs and 2+ pulses.  Lymph: No lymphadenopathy of posterior or anterior cervical chain or axillae bilaterally.  Gait normal with good balance and coordination.  MSK:  Non tender with full range of motion and good stability and symmetric strength and tone of shoulders, elbows, wrist, hip, knee and ankles bilaterally.  Back Exam:  Inspection: Loss of lordosis Motion: Flexion 35 deg, Extension 25 deg, Side Bending to 35 deg bilaterally,  Rotation to 45 deg bilaterally  SLR laying: Negative  XSLR laying: Negative  Palpable tenderness: Tender to palpation in the paraspinal musculature.  More over the left piriformis FABER: Mild positive left. Sensory change: Gross sensation intact to all lumbar and sacral dermatomes.  Reflexes: 2+ at both patellar tendons, 2+ at achilles tendons, Babinski's downgoing.  Strength at foot  Plantar-flexion: 5/5 Dorsi-flexion: 5/5 Eversion: 5/5 Inversion: 5/5  Leg strength  Quad: 5/5 Hamstring: 5/5 Hip flexor: 5/5 Hip abductors: 4/5 but symmetric Gait unremarkable.    Osteopathic finding C2 flexed rotated and side bent right T3 extended rotated and side bent right inhaled third rib T9 extended rotated and side bent left L flexed rotated and side bent right Sacrum left on left   [id number]; 15 additional minutes spent for Therapeutic exercises as stated in above  notes.  This included exercises focusing on stretching, strengthening, with significant focus on eccentric aspects.   Long term goals include an improvement in range of motion, strength, endurance as well as avoiding reinjury. Patient's frequency would include in 1-2 times a day, 3-5 times a week for a duration of 6-12 weeks.  Piriformis Syndrome  Using an anatomical model, reviewed with the patient the structures involved and how they related to diagnosis. The patient indicated understanding.   The patient was given a handout from Dr. Arne Cleveland book "The Sports Medicine Patient Advisor" describing the anatomy and rehabilitation of the following condition: Piriformis Syndrome  Also given a handout with more extensive Piriformis stretching, hip flexor and abductor strengthening, ham stretching  Rec deep massage, explained self-massage with ball Proper technique shown and discussed handout in great detail with ATC.  All questions were discussed and answered.    Impression and Recommendations:     This case required medical decision making of moderate complexity.

## 2017-10-21 ENCOUNTER — Ambulatory Visit: Payer: Medicare Other

## 2017-10-21 ENCOUNTER — Encounter: Payer: Self-pay | Admitting: Family Medicine

## 2017-10-21 ENCOUNTER — Ambulatory Visit: Payer: Medicare Other | Admitting: Family Medicine

## 2017-10-21 VITALS — BP 138/84 | HR 56 | Ht 62.0 in | Wt 124.0 lb

## 2017-10-21 DIAGNOSIS — M9904 Segmental and somatic dysfunction of sacral region: Secondary | ICD-10-CM | POA: Diagnosis not present

## 2017-10-21 DIAGNOSIS — G5702 Lesion of sciatic nerve, left lower limb: Secondary | ICD-10-CM | POA: Diagnosis not present

## 2017-10-21 DIAGNOSIS — M549 Dorsalgia, unspecified: Secondary | ICD-10-CM | POA: Diagnosis not present

## 2017-10-21 DIAGNOSIS — M999 Biomechanical lesion, unspecified: Secondary | ICD-10-CM | POA: Diagnosis not present

## 2017-10-21 NOTE — Assessment & Plan Note (Signed)
Piriformis syndrome.  Positive Corky Sox.  Pain over this area.  Differential includes a lumbar radiculopathy, sacral dysfunction as well.  We discussed the possibility of x-rays which patient declined secondary to her having significant number of x-rays recently.  Patient's history of cancer this was encouraged.  Discussed icing regimen, home exercises.  Attempted osteopathic manipulation.  Discussed the importance of core strength and stability.  Manual manipulation given follow-up 4 weeks

## 2017-10-21 NOTE — Patient Instructions (Signed)
Good to see you  [name] is your friend. Ice 20 minutes 2 times daily. Usually after activity and before bed. Tennis ball in back left pocket with sitting Exercises 3 times a week.  pennsaid pinkie amount topically 2 times daily as needed.  Over the counter get  Vitamin D 2000 IU daily  Turmeric 500mg  daily  Tart cherry extract any dose at night See me again in 3-4 weeks.

## 2017-10-21 NOTE — Assessment & Plan Note (Signed)
Decision today to treat with OMT was based on Physical Exam  After verbal consent patient was treated with HVLA, ME, FPR techniques in cervical, thoracic, lumbar and sacral areas  Patient tolerated the procedure well with improvement in symptoms  Patient given exercises, stretches and lifestyle modifications  See medications in patient instructions if given  Patient will follow up in 4 weeks 

## 2017-10-22 ENCOUNTER — Ambulatory Visit: Payer: Medicare Other

## 2017-10-25 ENCOUNTER — Ambulatory Visit
Admission: RE | Admit: 2017-10-25 | Discharge: 2017-10-25 | Disposition: A | Discharge status: Home / Self Care | Payer: Medicare Other | Source: Ambulatory Visit | Attending: Radiation Oncology | Admitting: Radiation Oncology

## 2017-10-25 DIAGNOSIS — Z510 Encounter for antineoplastic radiation therapy: Secondary | ICD-10-CM | POA: Diagnosis not present

## 2017-10-26 ENCOUNTER — Ambulatory Visit
Admission: RE | Admit: 2017-10-26 | Discharge: 2017-10-26 | Disposition: A | Discharge status: Home / Self Care | Payer: Medicare Other | Source: Ambulatory Visit | Attending: Radiation Oncology | Admitting: Radiation Oncology

## 2017-10-26 DIAGNOSIS — Z510 Encounter for antineoplastic radiation therapy: Secondary | ICD-10-CM | POA: Diagnosis not present

## 2017-10-27 ENCOUNTER — Ambulatory Visit
Admission: RE | Admit: 2017-10-27 | Discharge: 2017-10-27 | Disposition: A | Discharge status: Home / Self Care | Payer: Medicare Other | Source: Ambulatory Visit | Attending: Radiation Oncology | Admitting: Radiation Oncology

## 2017-10-27 DIAGNOSIS — Z510 Encounter for antineoplastic radiation therapy: Secondary | ICD-10-CM | POA: Diagnosis not present

## 2017-10-28 ENCOUNTER — Ambulatory Visit
Admission: RE | Admit: 2017-10-28 | Discharge: 2017-10-28 | Disposition: A | Discharge status: Home / Self Care | Payer: Medicare Other | Source: Ambulatory Visit | Attending: Radiation Oncology | Admitting: Radiation Oncology

## 2017-10-28 DIAGNOSIS — Z170 Estrogen receptor positive status [ER+]: Principal | ICD-10-CM

## 2017-10-28 DIAGNOSIS — Z510 Encounter for antineoplastic radiation therapy: Secondary | ICD-10-CM | POA: Diagnosis not present

## 2017-10-28 DIAGNOSIS — C50312 Malignant neoplasm of lower-inner quadrant of left female breast: Secondary | ICD-10-CM

## 2017-10-28 MED ORDER — RADIAPLEXRX EX GEL
Freq: Once | CUTANEOUS | Status: AC
  Administered 2017-10-28: 17:00:00 via TOPICAL

## 2017-10-28 MED ORDER — ALRA NON-METALLIC DEODORANT (RAD-ONC)
1.0000 "application " | Freq: Once | TOPICAL | Status: AC
  Administered 2017-10-28: 1 via TOPICAL

## 2017-10-29 ENCOUNTER — Ambulatory Visit
Admission: RE | Admit: 2017-10-29 | Discharge: 2017-10-29 | Disposition: A | Discharge status: Home / Self Care | Payer: Medicare Other | Source: Ambulatory Visit | Attending: Radiation Oncology | Admitting: Radiation Oncology

## 2017-10-29 DIAGNOSIS — Z510 Encounter for antineoplastic radiation therapy: Secondary | ICD-10-CM | POA: Diagnosis not present

## 2017-11-01 ENCOUNTER — Ambulatory Visit
Admission: RE | Admit: 2017-11-01 | Discharge: 2017-11-01 | Disposition: A | Discharge status: Home / Self Care | Payer: Medicare Other | Source: Ambulatory Visit | Attending: Radiation Oncology | Admitting: Radiation Oncology

## 2017-11-01 ENCOUNTER — Ambulatory Visit: Payer: Medicare Other | Admitting: Radiation Oncology

## 2017-11-01 DIAGNOSIS — Z510 Encounter for antineoplastic radiation therapy: Secondary | ICD-10-CM | POA: Diagnosis not present

## 2017-11-02 ENCOUNTER — Ambulatory Visit
Admission: RE | Admit: 2017-11-02 | Discharge: 2017-11-02 | Disposition: A | Discharge status: Home / Self Care | Payer: Medicare Other | Source: Ambulatory Visit | Attending: Radiation Oncology | Admitting: Radiation Oncology

## 2017-11-02 DIAGNOSIS — Z170 Estrogen receptor positive status [ER+]: Secondary | ICD-10-CM | POA: Diagnosis not present

## 2017-11-02 DIAGNOSIS — C50312 Malignant neoplasm of lower-inner quadrant of left female breast: Secondary | ICD-10-CM | POA: Insufficient documentation

## 2017-11-02 DIAGNOSIS — Z510 Encounter for antineoplastic radiation therapy: Secondary | ICD-10-CM | POA: Diagnosis not present

## 2017-11-03 ENCOUNTER — Ambulatory Visit
Admission: RE | Admit: 2017-11-03 | Discharge: 2017-11-03 | Disposition: A | Discharge status: Home / Self Care | Payer: Medicare Other | Source: Ambulatory Visit | Attending: Radiation Oncology | Admitting: Radiation Oncology

## 2017-11-03 DIAGNOSIS — C50312 Malignant neoplasm of lower-inner quadrant of left female breast: Secondary | ICD-10-CM | POA: Diagnosis not present

## 2017-11-04 ENCOUNTER — Ambulatory Visit
Admission: RE | Admit: 2017-11-04 | Discharge: 2017-11-04 | Disposition: A | Discharge status: Home / Self Care | Payer: Medicare Other | Source: Ambulatory Visit | Attending: Radiation Oncology | Admitting: Radiation Oncology

## 2017-11-04 DIAGNOSIS — C50312 Malignant neoplasm of lower-inner quadrant of left female breast: Secondary | ICD-10-CM | POA: Diagnosis not present

## 2017-11-05 ENCOUNTER — Ambulatory Visit
Admission: RE | Admit: 2017-11-05 | Discharge: 2017-11-05 | Disposition: A | Discharge status: Home / Self Care | Payer: Medicare Other | Source: Ambulatory Visit | Attending: Radiation Oncology | Admitting: Radiation Oncology

## 2017-11-05 DIAGNOSIS — C50312 Malignant neoplasm of lower-inner quadrant of left female breast: Secondary | ICD-10-CM | POA: Diagnosis not present

## 2017-11-08 ENCOUNTER — Ambulatory Visit
Admission: RE | Admit: 2017-11-08 | Discharge: 2017-11-08 | Disposition: A | Discharge status: Home / Self Care | Payer: Medicare Other | Source: Ambulatory Visit | Attending: Radiation Oncology | Admitting: Radiation Oncology

## 2017-11-08 ENCOUNTER — Ambulatory Visit: Payer: Medicare Other | Admitting: Radiation Oncology

## 2017-11-08 DIAGNOSIS — C50312 Malignant neoplasm of lower-inner quadrant of left female breast: Secondary | ICD-10-CM | POA: Diagnosis not present

## 2017-11-09 ENCOUNTER — Ambulatory Visit
Admission: RE | Admit: 2017-11-09 | Discharge: 2017-11-09 | Disposition: A | Discharge status: Home / Self Care | Payer: Medicare Other | Source: Ambulatory Visit | Attending: Radiation Oncology | Admitting: Radiation Oncology

## 2017-11-09 DIAGNOSIS — C50312 Malignant neoplasm of lower-inner quadrant of left female breast: Secondary | ICD-10-CM | POA: Diagnosis not present

## 2017-11-10 ENCOUNTER — Ambulatory Visit
Admission: RE | Admit: 2017-11-10 | Discharge: 2017-11-10 | Disposition: A | Discharge status: Home / Self Care | Payer: Medicare Other | Source: Ambulatory Visit | Attending: Radiation Oncology | Admitting: Radiation Oncology

## 2017-11-10 DIAGNOSIS — C50312 Malignant neoplasm of lower-inner quadrant of left female breast: Secondary | ICD-10-CM | POA: Diagnosis not present

## 2017-11-11 ENCOUNTER — Ambulatory Visit
Admission: RE | Admit: 2017-11-11 | Discharge: 2017-11-11 | Disposition: A | Discharge status: Home / Self Care | Payer: Medicare Other | Source: Ambulatory Visit | Attending: Radiation Oncology | Admitting: Radiation Oncology

## 2017-11-11 DIAGNOSIS — C50312 Malignant neoplasm of lower-inner quadrant of left female breast: Secondary | ICD-10-CM | POA: Diagnosis not present

## 2017-11-11 NOTE — Progress Notes (Signed)
[facility]  [phone] [phone]     ID: [name] DOB: 1945-12-05  MR#: [id number]  RCV#:[id number]  Patient Care Team: Leeroy Cha, MD as PCP - General (Internal Medicine) Eppie Gibson, MD as Attending Physician (Radiation Oncology) Bodhi Moradi, Virgie Dad, MD as Consulting Physician (Oncology) Martinique, Amy, MD as Consulting Physician (Dermatology) Vickey Huger, MD as Consulting Physician (Orthopedic Surgery) Alphonsa Overall, MD as Consulting Physician (General Surgery) OTHER MD:  CHIEF COMPLAINT: Ductal carcinoma in situ, estrogen receptor positive  CURRENT TREATMENT: adjuvant radiation in process   HISTORY OF CURRENT ILLNESS: From the original intake note:  "Barbara Ferguson" had screening mammography 10/31/2013 at [facility] with results showing: An area of suspicious calcifications in the left breast.. On 12/08/2013, she had a unilateral left mammography at [place] with results showing: Breast density category D. Likely benign calcifications in the inner left breast, middle 1/3. At this visit, it was recommended that the patient follow up in 6 months for a diagnostic left mammogram with spot magnifications.  However the patient did not show for follow-up  More recently bilateral diagnostic mammography with tomography at [facility]  07/19/2017 showed increasing indeterminate calcifications within the lower slightly inner left breast. No mammographic evidence of right breast malignancy. Biopsy of the breast lesion in question on 07/23/2017 showed ([id number]) ductal carcinoma in situ, grade 2 to 3. Suspicious for focal microscopic invasion. Prognostic indicators showed: ER 100% positive with strong staining intensity and PR with 90% positive with strong staining intensity.   The patient's subsequent history is as detailed below.  INTERVAL HISTORY: Barbara Ferguson returns today for follow up and treatment of her estrogen receptor positive  DCIS. She is currently receiving radiation treatments. She tolerates this well.   Since her last visit, she underwent left breast lumpectomy and left axillary sentinel lymph node sampling ([id number]) on 09/10/2017 with pathology showing: Ductal carcinoma in situ with calcifications, intermediate grade spanning 1.0 cm. No evidence of carcinoma in 4 sentinel lymph nodes (0/4). No evidence of malignancy in the inferior margin.   REVIEW OF SYSTEMS: Vandora reports that her lumpectomy went well. She notes that she developed an infection 3 weeks following surgery and was prescribed antibiotics. She notes that on Christmas, she developed mild back pain on the left side. She notes that it was severe a week later and started to extend down her left leg. She notes that she followed up with her PCP noted that it was lymph channel inflammation. She also saw sports Dr. Ortencia Kick, who advised that it was not sciatica because her leg felt better when she moved it. She notes that her back and leg pain have improved. She notes that her hips hurt after standing for long periods. For exercise, she plays pickle ball, tennis, and does yoga.  She notes that she exercises with more caution. She denies unusual headaches, visual changes, nausea, vomiting, or dizziness. There has been no unusual cough, phlegm production, or pleurisy. This been no change in bowel or bladder habits. She denies unexplained fatigue or unexplained weight loss, bleeding, rash, or fever. A detailed review of systems was otherwise stable.    PAST MEDICAL HISTORY: Past Medical History:  Diagnosis Date  . Arthritis   . 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 Family history of breast cancer   . Family history of colon cancer   . History of colon polyps    benign  . History of kidney stones   .  Hypothyroidism    takes SYnthroid daily  . Joint pain   . Joint swelling   . PONV (postoperative nausea and vomiting)     PAST  SURGICAL HISTORY: Past Surgical History:  Procedure Laterality Date  . BREAST LUMPECTOMY WITH RADIOACTIVE SEED AND SENTINEL LYMPH NODE BIOPSY Left 09/10/2017   Procedure: LEFT BREAST LUMPECTOMY WITH RADIOACTIVE SEED AND LEFT AXILLARY SENTINEL LYMPH NODE BIOPSY;  Surgeon: Alphonsa Overall, MD;  Location: [place];  Service: General;  Laterality: Left;  . COLONOSCOPY WITH PROPOFOL N/A 09/04/2014   Procedure: COLONOSCOPY WITH PROPOFOL;  Surgeon: Garlan Fair, MD;  Location: [facility];  Service: Endoscopy;  Laterality: N/A;  . DILATION AND CURETTAGE OF UTERUS    . 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 TONSILLECTOMY    . TOTAL KNEE ARTHROPLASTY Left 01/20/2016   Procedure: TOTAL KNEE ARTHROPLASTY;  Surgeon: Vickey Huger, MD;  Location: [place];  Service: Orthopedics;  Laterality: Left;  . TOTAL KNEE ARTHROPLASTY Right 06/01/2016   Procedure: TOTAL KNEE ARTHROPLASTY;  Surgeon: Vickey Huger, MD;  Location: [place];  Service: Orthopedics;  Laterality: Right;    FAMILY HISTORY Family History  Problem Relation Age of Onset  . Breast cancer Sister 60       had Genetic testing in ~2016, reportedly negative  . Stroke Mother   . Colon cancer Maternal Grandmother 75  . Heart disease Maternal Grandfather   Her father died at age 38 from old age and her mother died at age 15 from a stroke. She has one brother and one sister. Her sister was diagnosed at age 70 with breast cancer.    GYNECOLOGIC HISTORY:  No LMP recorded. Patient is postmenopausal. Menarche: 72 years old Age at first live birth: 72 years old GP: GXP2 LMP: March 1996  Contraceptive: Yes, OCP HRT: No    SOCIAL HISTORY: She is a retired Copywriter, advertising. Her husband Wille Glaser is retired from Biomedical engineer. She has two children, Barbara Ferguson and Barbara Ferguson. Barbara Ferguson is a Civil engineer, contracting living in [place].  Faith Rogue is a Radio producer in [place].  The patient has 3 grandchildren. She belongs to [employer].  Incidentally the patient is the mother of a  close friend of our physical therapist Raeanne Gathers     ADVANCED DIRECTIVES: Yes, her husband Wille Glaser is her Press photographer.    HEALTH MAINTENANCE: Social History   Tobacco Use  . Smoking status: Former Smoker    Last attempt to quit: 08/04/1966    Years since quitting: 51.3  . Smokeless tobacco: Never Used  . Tobacco comment: smoked for 1 year  Substance Use Topics  . Alcohol use: Yes    Comment: rare alcohol use  . Drug use: No     Colonoscopy: Yes, completed through [facility]  PAP:  Bone density: Her prior bone density scan resulted with osteopenia per patient. She is due for another one.    No Known Allergies  Current Outpatient Medications  Medication Sig Dispense Refill  . cholecalciferol (VITAMIN D) 1000 units tablet Take 1,000 Units by mouth daily.    Marland Kitchen glucosamine-chondroitin 500-400 MG tablet Take 1 tablet by mouth daily.     Marland Kitchen HYDROcodone-acetaminophen (NORCO/VICODIN) 5-325 MG tablet Take 1-2 tablets by mouth every 6 (six) hours as needed for moderate pain. (Patient not taking: Reported on 09/29/2017) 15 tablet 0  . levothyroxine (SYNTHROID, LEVOTHROID) 75 MCG tablet Take 75 mcg by mouth daily before breakfast.    . Multiple Vitamins-Minerals (MULTIVITAMIN & MINERAL  PO) Take 1 tablet by mouth daily.     No current facility-administered medications for this visit.     OBJECTIVE: Middle-aged white woman no acute distress  Vitals:   11/15/17 1354  BP: 121/84  Pulse: 80  Resp: 18  Temp: 98.3 F (36.8 C)  SpO2: 100%     Body mass index is 22.2 kg/m.   Wt Readings from Last 3 Encounters:  11/15/17 121 lb 6.4 oz (55.1 kg)  10/21/17 124 lb (56.2 kg)  09/29/17 123 lb 12.8 oz (56.2 kg)      ECOG FS:1 - Symptomatic but completely ambulatory  Sclerae unicteric, EOMs intact Oropharynx clear and moist No cervical or supraclavicular adenopathy Lungs no rales or rhonchi Heart regular rate and rhythm Abd soft, nontender, positive bowel  sounds MSK no focal spinal tenderness, no upper extremity lymphedema, straight leg raising bilaterally is normal, no ankle edema Neuro: nonfocal, well oriented, appropriate affect Breasts: The right breast is benign.  The left breast is status post lumpectomy and is currently receiving radiation.  The cosmetic result is excellent.  Both axilla are benign Skin: I do not see any enlarged vessels in the left anterior chest wall.   LAB RESULTS:  CMP     Component Value Date/Time   NA 138 08/04/2017 0846   K 4.3 08/04/2017 0846   CL 104 06/02/2016 0500   CO2 24 08/04/2017 0846   GLUCOSE 85 08/04/2017 0846   BUN 16.9 08/04/2017 0846   CREATININE 1.2 (H) 08/04/2017 0846   CALCIUM 10.1 08/04/2017 0846   PROT 7.2 08/04/2017 0846   ALBUMIN 4.3 08/04/2017 0846   AST 25 08/04/2017 0846   ALT 22 08/04/2017 0846   ALKPHOS 82 08/04/2017 0846   BILITOT 0.82 08/04/2017 0846   GFRNONAA 50 (L) 06/02/2016 0500   GFRAA 58 (L) 06/02/2016 0500    No results found for: TOTALPROTELP, ALBUMINELP, A1GS, A2GS, BETS, BETA2SER, GAMS, MSPIKE, SPEI  No results found for: KPAFRELGTCHN, LAMBDASER, KAPLAMBRATIO  Lab Results  Component Value Date   WBC 7.6 09/06/2017   NEUTROABS 4.0 08/04/2017   HGB 12.6 09/06/2017   HCT 38.1 09/06/2017   MCV 90.9 09/06/2017   PLT 257 09/06/2017    @LASTCHEMISTRY @  No results found for: LABCA2  No components found for: BHALPF790  No results for input(s): INR in the last 168 hours.  No results found for: LABCA2  No results found for: WIO973  No results found for: ZHG992  No results found for: EQA834  No results found for: CA2729  No components found for: HGQUANT  No results found for: CEA1 / No results found for: CEA1   No results found for: AFPTUMOR  No results found for: CHROMOGRNA  No results found for: PSA1  No visits with results within 3 Day(s) from this visit.  Latest known visit with results is:  Hospital Outpatient Visit on 09/06/2017   Component Date Value Ref Range Status  . WBC 09/06/2017 7.6  4.0 - 10.5 K/uL Final  . RBC 09/06/2017 4.19  3.87 - 5.11 MIL/uL Final  . Hemoglobin 09/06/2017 12.6  12.0 - 15.0 g/dL Final  . HCT 09/06/2017 38.1  36.0 - 46.0 % Final  . MCV 09/06/2017 90.9  78.0 - 100.0 fL Final  . MCH 09/06/2017 30.1  26.0 - 34.0 pg Final  . MCHC 09/06/2017 33.1  30.0 - 36.0 g/dL Final  . RDW 09/06/2017 14.3  11.5 - 15.5 % Final  . Platelets 09/06/2017 257  150 - 400  K/uL Final    (this displays the last labs from the last 3 days)  No results found for: TOTALPROTELP, ALBUMINELP, A1GS, A2GS, BETS, BETA2SER, GAMS, MSPIKE, SPEI (this displays SPEP labs)  No results found for: KPAFRELGTCHN, LAMBDASER, KAPLAMBRATIO (kappa/lambda light chains)  No results found for: HGBA, HGBA2QUANT, HGBFQUANT, HGBSQUAN (Hemoglobinopathy evaluation)   No results found for: LDH  No results found for: IRON, TIBC, IRONPCTSAT (Iron and TIBC)  No results found for: FERRITIN  Urinalysis    Component Value Date/Time   COLORURINE YELLOW 05/21/2016 [place] 05/21/2016 1055   LABSPEC 1.017 05/21/2016 1055   PHURINE 5.0 05/21/2016 1055   GLUCOSEU NEGATIVE 05/21/2016 1055   HGBUR NEGATIVE 05/21/2016 1055   BILIRUBINUR NEGATIVE 05/21/2016 1055   KETONESUR NEGATIVE 05/21/2016 1055   PROTEINUR NEGATIVE 05/21/2016 1055   NITRITE NEGATIVE 05/21/2016 1055   LEUKOCYTESUR SMALL (A) 05/21/2016 1055     STUDIES: No results found.  ELIGIBLE FOR AVAILABLE RESEARCH PROTOCOL: No  ASSESSMENT: 72 y.o. [place] woman status post left breast lower inner quadrant biopsy July 23, 2017 for ductal carcinoma in situ, grade 2 or 3, with areas suspicious for microinvasion; the tumor being estrogen and progesterone receptor positive  (1) breast conserving surgery with sentinel lymph node sampling 09/10/2017 confirmed pTis pN0, stage 0 DCIS, grade 2, with 4 axillary nodes negative and clear margins  (2) adjuvant  radiation to be completed 11/22/2017  (3) considered antiestrogens but decided against them  (a) bone density at [place] 02/21/2015 showed a T score of -1.9  PLAN: Barbara Ferguson will complete her radiation treatments next week.  Today we discussed the possible benefits of antiestrogens.  She understands either tamoxifen or anastrozole will cut her risk of local recurrence in half and also cut in half her risk of developing another breast cancer in the future.    She understands that anastrozole and the aromatase inhibitors in general work by blocking estrogen production. Accordingly vaginal dryness, decrease in bone density, and of course hot flashes can result. The aromatase inhibitors can also negatively affect the cholesterol profile, although that is a minor effect. One out of 5 women on aromatase inhibitors we will feel "old and achy". This arthralgia/myalgia syndrome, which resembles fibromyalgia clinically, does resolve with stopping the medications. Accordingly this is not a reason to not try an aromatase inhibitor but it is a frequent reason to stop it (in other words 20% of women will not be able to tolerate these medications).  Tamoxifen on the other hand does not block estrogen production. It does not "take away a woman's estrogen". It blocks the estrogen receptor in breast cells. Like anastrozole, it can also cause hot flashes. As opposed to anastrozole, tamoxifen has many estrogen-like effects. It is technically an estrogen receptor modulator. This means that in some tissues tamoxifen works like estrogen-- for example it helps strengthen the bones. It tends to improve the cholesterol profile. It can cause thickening of the endometrial lining, and even endometrial polyps or rarely cancer of the uterus.(The risk of uterine cancer due to tamoxifen is one additional cancer per thousand women year). It can cause vaginal wetness or stickiness. It can cause blood clots through this estrogen-like  effect--the risk of blood clots with tamoxifen is exactly the same as with birth control pills or hormone replacement.  Neither of these agents causes mood changes or weight gain, despite the popular belief that they can have these side effects. We have data from studies comparing either of these  drugs with placebo, and in those cases the control group had the same amount of weight gain and depression as the group that took the drug.  After this discussion and with a good understanding of her general situation Barbara Ferguson feels comfortable foregoing the risk reduction strategy with antiestrogens and being observed off treatment.  We discussed the fact that she may have had a version of [place]'s disease or condition, but that seems to have resolved.  I do not have a simple explanation for the feeling she is experiencing in her left lower extremity.  Possibly the position that she had to be in during surgery stretched something and that became uncomfortable later.  This is not keeping her from her normal activities and I expected will clear with time  She will see me in late October.  If all is going well at that time, since she feels she is already seeing multiple other doctors regularly, she may choose to forego further follow-up here.    Milka Windholz, Virgie Dad, MD  11/15/17 2:13 PM Medical Oncology and Hematology [facility] [address] [place], [place] [address] Tel. [phone]    Fax. [phone]  This document serves as a record of services personally performed by Lurline Del, MD. It was created on his behalf by Sheron Nightingale, a trained medical scribe. The creation of this record is based on the scribe's personal observations and the provider's statements to them.   I have reviewed the above documentation for accuracy and completeness, and I agree with the above.

## 2017-11-12 ENCOUNTER — Ambulatory Visit
Admission: RE | Admit: 2017-11-12 | Discharge: 2017-11-12 | Disposition: A | Discharge status: Home / Self Care | Payer: Medicare Other | Source: Ambulatory Visit | Attending: Radiation Oncology | Admitting: Radiation Oncology

## 2017-11-12 DIAGNOSIS — C50312 Malignant neoplasm of lower-inner quadrant of left female breast: Secondary | ICD-10-CM | POA: Diagnosis not present

## 2017-11-15 ENCOUNTER — Telehealth: Payer: Self-pay | Admitting: Oncology

## 2017-11-15 ENCOUNTER — Ambulatory Visit
Admission: RE | Admit: 2017-11-15 | Discharge: 2017-11-15 | Disposition: A | Discharge status: Home / Self Care | Payer: Medicare Other | Source: Ambulatory Visit | Attending: Radiation Oncology | Admitting: Radiation Oncology

## 2017-11-15 ENCOUNTER — Ambulatory Visit: Payer: Medicare Other

## 2017-11-15 ENCOUNTER — Inpatient Hospital Stay: Payer: Medicare Other | Attending: Oncology | Admitting: Oncology

## 2017-11-15 VITALS — BP 121/84 | HR 80 | Temp 98.3°F | Resp 18 | Ht 62.0 in | Wt 121.4 lb

## 2017-11-15 DIAGNOSIS — Z170 Estrogen receptor positive status [ER+]: Secondary | ICD-10-CM | POA: Insufficient documentation

## 2017-11-15 DIAGNOSIS — Z87891 Personal history of nicotine dependence: Secondary | ICD-10-CM | POA: Insufficient documentation

## 2017-11-15 DIAGNOSIS — Z79899 Other long term (current) drug therapy: Secondary | ICD-10-CM | POA: Diagnosis not present

## 2017-11-15 DIAGNOSIS — D0512 Intraductal carcinoma in situ of left breast: Secondary | ICD-10-CM | POA: Diagnosis present

## 2017-11-15 DIAGNOSIS — C50312 Malignant neoplasm of lower-inner quadrant of left female breast: Secondary | ICD-10-CM

## 2017-11-15 NOTE — Telephone Encounter (Signed)
Gave avs and calendar for october °

## 2017-11-15 NOTE — Progress Notes (Signed)
[facility] [place], [place] [address] Phone: [phone] Subjective:    I'm seeing this patient by the request  of:    CC: Back pain follow-up  CXK:[id number]  Barbara Ferguson is a 72 y.o. female coming in with complaint of back pain.  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  Given home exercises, icing regimen, discussed manual manipulation.  Patient states overall doing relatively well.  Patient is having making improvement states that she feels 80% better.  States that the severe pain is significantly less.  No radiation down the leg.  Home exercises have been helpful.  Patient has had the treatment also for breast cancer.  Getting daily radiation at the moment.  Patient's last radiation is Monday.  Has been feeling relatively good throughout the process.     Past Medical History:  Diagnosis Date  . Arthritis   . 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 Family history of breast cancer   . Family history of colon cancer   . History of colon polyps    benign  . History of kidney stones   . Hypothyroidism    takes SYnthroid daily  . Joint pain   . Joint swelling   . PONV (postoperative nausea and vomiting)    Past Surgical History:  Procedure Laterality Date  . BREAST LUMPECTOMY WITH RADIOACTIVE SEED AND SENTINEL LYMPH NODE BIOPSY Left 09/10/2017   Procedure: LEFT BREAST LUMPECTOMY WITH RADIOACTIVE SEED AND LEFT AXILLARY SENTINEL LYMPH NODE BIOPSY;  Surgeon: Alphonsa Overall, MD;  Location: [place];  Service: General;  Laterality: Left;  . COLONOSCOPY WITH PROPOFOL N/A 09/04/2014   Procedure: COLONOSCOPY WITH PROPOFOL;  Surgeon: Garlan Fair, MD;  [facility];  Service: Endoscopy;  Laterality: N/A;  . DILATION AND CURETTAGE OF UTERUS    . 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 TONSILLECTOMY    . TOTAL KNEE ARTHROPLASTY Left 01/20/2016   Procedure: TOTAL KNEE ARTHROPLASTY;  Surgeon: Vickey Huger, MD;  Location: [place];   Service: Orthopedics;  Laterality: Left;  . TOTAL KNEE ARTHROPLASTY Right 06/01/2016   Procedure: TOTAL KNEE ARTHROPLASTY;  Surgeon: Vickey Huger, MD;  Location: [place];  Service: Orthopedics;  Laterality: Right;   Social History   Socioeconomic History  . Marital status: Married    Spouse name: Not on file  . Number of children: Not on file  . Years of education: Not on file  . Highest education level: Not on file  Social Needs  . Financial resource strain: Not on file  . Food insecurity - worry: Not on file  . Food insecurity - inability: Not on file  . Transportation needs - medical: Not on file  . Transportation needs - non-medical: Not on file  Occupational History  . Not on file  Tobacco Use  . Smoking status: Former Smoker    Last attempt to quit: 08/04/1966    Years since quitting: 51.3  . Smokeless tobacco: Never Used  . Tobacco comment: smoked for 1 year  Substance and Sexual Activity  . Alcohol use: Yes    Comment: rare alcohol use  . Drug use: No  . Sexual activity: Not on file  Other Topics Concern  . Not on file  Social History Narrative  . Not on file   No Known Allergies Family History  Problem Relation Age of Onset  . Breast cancer Sister 64       had Genetic testing in ~2016,  reportedly negative  . Stroke Mother   . Colon cancer Maternal Grandmother 59  . Heart disease Maternal Grandfather      Past medical history, social, surgical and family history all reviewed in electronic medical record.  No pertanent information unless stated regarding to the chief complaint.   Review of Systems:Review of systems updated and as accurate as of 11/15/17  No headache, visual changes, nausea, vomiting, diarrhea, constipation, dizziness, abdominal pain, skin rash, fevers, chills, night sweats, weight loss, swollen lymph nodes, body aches, joint swelling, , chest pain, shortness of breath, mood changes.  Mild positive muscle aches  Objective  There were no vitals  taken for this visit. Systems examined below as of 11/15/17   General: No apparent distress alert and oriented x3 mood and affect normal, dressed appropriately.  HEENT: Pupils equal, extraocular movements intact  Respiratory: Patient's speak in full sentences and does not appear short of breath  Cardiovascular: No lower extremity edema, non tender, no erythema  Skin: Warm dry intact with no signs of infection or rash on extremities or on axial skeleton.  Abdomen: Soft nontender  Neuro: Cranial nerves II through XII are intact, neurovascularly intact in all extremities with 2+ DTRs and 2+ pulses.  Lymph: No lymphadenopathy of posterior or anterior cervical chain or axillae bilaterally.  Gait normal with good balance and coordination.  MSK:  Non tender with full range of motion and good stability and symmetric strength and tone of shoulders, elbows, wrist, , and ankles bilaterally.  Bilateral knee replacement Right hip exam shows decreasing range of motion.  Pain with internal rotation of the hip with decreased motion and release 5-10 degrees in all planes.  Negative straight leg test.  Still tenderness to palpation over the piriformis.  Positive Corky Sox on the left   Osteopathic findings T3 extended rotated and side bent right inhaled third rib T9 extended rotated and side bent left L2 flexed rotated and side bent right Sacrum left on left       Impression and Recommendations:     This case required medical decision making of moderate complexity.      Note: This dictation was prepared with Dragon dictation along with smaller phrase technology. Any transcriptional errors that result from this process are unintentional.

## 2017-11-16 ENCOUNTER — Ambulatory Visit: Payer: Medicare Other | Admitting: Family Medicine

## 2017-11-16 ENCOUNTER — Ambulatory Visit
Admission: RE | Admit: 2017-11-16 | Discharge: 2017-11-16 | Disposition: A | Discharge status: Home / Self Care | Payer: Medicare Other | Source: Ambulatory Visit | Attending: Radiation Oncology | Admitting: Radiation Oncology

## 2017-11-16 ENCOUNTER — Encounter: Payer: Self-pay | Admitting: Family Medicine

## 2017-11-16 VITALS — BP 120/70 | HR 47 | Ht 62.0 in | Wt 125.0 lb

## 2017-11-16 DIAGNOSIS — M9984 Other biomechanical lesions of sacral region: Secondary | ICD-10-CM | POA: Diagnosis not present

## 2017-11-16 DIAGNOSIS — C50312 Malignant neoplasm of lower-inner quadrant of left female breast: Secondary | ICD-10-CM | POA: Diagnosis not present

## 2017-11-16 DIAGNOSIS — M999 Biomechanical lesion, unspecified: Secondary | ICD-10-CM

## 2017-11-16 DIAGNOSIS — G5702 Lesion of sciatic nerve, left lower limb: Secondary | ICD-10-CM | POA: Diagnosis not present

## 2017-11-16 DIAGNOSIS — M9983 Other biomechanical lesions of lumbar region: Secondary | ICD-10-CM | POA: Diagnosis not present

## 2017-11-16 NOTE — Assessment & Plan Note (Signed)
Decision today to treat with OMT was based on Physical Exam  After verbal consent patient was treated with ME, FPR techniques in cervical, thoracic, lumbar and sacral areas  Patient tolerated the procedure well with improvement in symptoms  Patient given exercises, stretches and lifestyle modifications  See medications in patient instructions if given  We discussed the relative risk with patient having a history of cancer.  Patient declined x-rays again at this time.  Patient is in agreement to take the risk, and will do the manipulation before improvement in quality of life and decrease in pain. Patient will follow up in 6-8 weeks

## 2017-11-16 NOTE — Patient Instructions (Signed)
Good to see you  Barbara Ferguson is your friend.  I am impressed  Continue the exercises 1-2 times a week  Stay active See em again in 6-8 weeks

## 2017-11-16 NOTE — Assessment & Plan Note (Signed)
Discussed with patient again at great length.  Doing relatively well.  Discussed the importance of the hip abductor strengthening.  Discussed icing regimen.  Follow-up again in 6-8 weeks.

## 2017-11-17 ENCOUNTER — Ambulatory Visit
Admission: RE | Admit: 2017-11-17 | Discharge: 2017-11-17 | Disposition: A | Discharge status: Home / Self Care | Payer: Medicare Other | Source: Ambulatory Visit | Attending: Radiation Oncology | Admitting: Radiation Oncology

## 2017-11-17 DIAGNOSIS — C50312 Malignant neoplasm of lower-inner quadrant of left female breast: Secondary | ICD-10-CM | POA: Diagnosis not present

## 2017-11-18 ENCOUNTER — Ambulatory Visit
Admission: RE | Admit: 2017-11-18 | Discharge: 2017-11-18 | Disposition: A | Discharge status: Home / Self Care | Payer: Medicare Other | Source: Ambulatory Visit | Attending: Radiation Oncology | Admitting: Radiation Oncology

## 2017-11-18 DIAGNOSIS — C50312 Malignant neoplasm of lower-inner quadrant of left female breast: Secondary | ICD-10-CM | POA: Diagnosis not present

## 2017-11-19 ENCOUNTER — Ambulatory Visit
Admission: RE | Admit: 2017-11-19 | Discharge: 2017-11-19 | Disposition: A | Discharge status: Home / Self Care | Payer: Medicare Other | Source: Ambulatory Visit | Attending: Radiation Oncology | Admitting: Radiation Oncology

## 2017-11-19 DIAGNOSIS — C50312 Malignant neoplasm of lower-inner quadrant of left female breast: Secondary | ICD-10-CM | POA: Diagnosis not present

## 2017-11-22 ENCOUNTER — Ambulatory Visit
Admission: RE | Admit: 2017-11-22 | Discharge: 2017-11-22 | Disposition: A | Discharge status: Home / Self Care | Payer: Medicare Other | Source: Ambulatory Visit | Attending: Radiation Oncology | Admitting: Radiation Oncology

## 2017-11-22 DIAGNOSIS — C50312 Malignant neoplasm of lower-inner quadrant of left female breast: Secondary | ICD-10-CM | POA: Diagnosis not present

## 2017-11-22 DIAGNOSIS — Z170 Estrogen receptor positive status [ER+]: Principal | ICD-10-CM

## 2017-11-22 MED ORDER — RADIAPLEXRX EX GEL
Freq: Once | CUTANEOUS | Status: AC
  Administered 2017-11-22: 16:00:00 via TOPICAL

## 2017-11-23 ENCOUNTER — Encounter: Payer: Self-pay | Admitting: Radiation Oncology

## 2017-11-23 NOTE — Progress Notes (Signed)
  Radiation Oncology         [phone] ________________________________  Name: Barbara Ferguson MRN: [id number]  Date: 11/23/2017  DOB: 09-28-1946  End of Treatment Note  Diagnosis:   Stage0 [id number] LIQDCIS  ERpositive/ PR positive, Intermediate Grade     Indication for treatment:  Curative       Radiation treatment dates:   10/26/17 - 11/22/17  Site/dose:   1)Left breast treated to 40.05 Gy with 15 fx of 2.67 Gy and   2) boost of 10 Gy with 5 fx of 2 Gy  Beams/energy:   1) 3D / 6X 2) photons/6X  Narrative: The patient tolerated radiation treatment relatively well.   Patient did complain of sensitivity at breast, mild fatigue, and slight rash near radiation sight. She endorsed use of Radiaplex gel.  Plan: The patient has completed radiation treatment. The patient will return to radiation oncology clinic for routine followup in one month. I advised them to call or return sooner if they have any questions or concerns related to their recovery or treatment.  -----------------------------------  Eppie Gibson, MD  This document serves as a record of services personally performed by Eppie Gibson, MD. It was created on his behalf by Linward Natal, a trained medical scribe. The creation of this record is based on the scribe's personal observations and the provider's statements to them. This document has been checked and approved by the attending provider.

## 2017-12-23 ENCOUNTER — Encounter: Payer: Self-pay | Admitting: Radiation Oncology

## 2017-12-24 ENCOUNTER — Ambulatory Visit
Admission: RE | Admit: 2017-12-24 | Discharge: 2017-12-24 | Disposition: A | Discharge status: Home / Self Care | Payer: Medicare Other | Source: Ambulatory Visit | Attending: Radiation Oncology | Admitting: Radiation Oncology

## 2017-12-24 ENCOUNTER — Other Ambulatory Visit: Payer: Self-pay

## 2017-12-24 ENCOUNTER — Encounter: Payer: Self-pay | Admitting: Radiation Oncology

## 2017-12-24 VITALS — BP 127/84 | HR 72 | Ht 62.0 in | Wt 120.4 lb

## 2017-12-24 DIAGNOSIS — Z923 Personal history of irradiation: Secondary | ICD-10-CM | POA: Diagnosis not present

## 2017-12-24 DIAGNOSIS — L814 Other melanin hyperpigmentation: Secondary | ICD-10-CM | POA: Insufficient documentation

## 2017-12-24 DIAGNOSIS — C50312 Malignant neoplasm of lower-inner quadrant of left female breast: Secondary | ICD-10-CM | POA: Diagnosis not present

## 2017-12-24 DIAGNOSIS — Z79899 Other long term (current) drug therapy: Secondary | ICD-10-CM | POA: Insufficient documentation

## 2017-12-24 DIAGNOSIS — Z170 Estrogen receptor positive status [ER+]: Secondary | ICD-10-CM | POA: Insufficient documentation

## 2017-12-24 DIAGNOSIS — Z79890 Hormone replacement therapy (postmenopausal): Secondary | ICD-10-CM | POA: Diagnosis not present

## 2017-12-24 HISTORY — DX: Personal history of irradiation: Z92.3

## 2017-12-24 NOTE — Progress Notes (Signed)
  Radiation Oncology         [phone] ________________________________  Name: Barbara Ferguson MRN: [id number]  Date: 12/24/2017  DOB: 1946-01-10  Follow-Up Visit Note  Outpatient  CC: Leeroy Cha, MD  Magrinat, Virgie Dad, MD  Diagnosis and Prior Radiotherapy:    ICD-10-CM   1. Malignant neoplasm of lower-inner quadrant of left breast in female, estrogen receptor positive (Melbeta) C50.312    Z17.0     Stage0 [id number] LIQDCIS ERpositive/ PR positive,Intermediate Grade   Radiation treatment dates:   10/26/2017 - 11/22/2017 Site/dose:   1. Left Breast / 40.05 Gy in 15 fractions of 2.67 Gy  2. Left Breast Boost / 10 Gy in 5 fractions of 2 Gy  CHIEF COMPLAINT: Here for follow-up and surveillance of left breast cancer  Narrative:  The patient returns today for routine follow-up of radiation completed 1 month ago to her left breast. She denies pain. She reports mild fatigue in the afternoons. She reports continued darkened skin to her left breast that has improved since the end of treatment. She continues to use Radiaplex 1-2 times daily. She will begin using a vitamin E cream when the Radiaplex is completed. She denies taking anti-estrogen pills and will see Dr. Jana Hakim next on 08/01/2018.                         ALLERGIES:  has No Known Allergies.  Meds: Current Outpatient Medications  Medication Sig Dispense Refill  . glucosamine-chondroitin 500-400 MG tablet Take 1 tablet by mouth daily.     Marland Kitchen levothyroxine (SYNTHROID, LEVOTHROID) 75 MCG tablet Take 75 mcg by mouth daily before breakfast.    . Multiple Vitamins-Minerals (MULTIVITAMIN & MINERAL PO) Take 1 tablet by mouth daily.    . cholecalciferol (VITAMIN D) 1000 units tablet Take 1,000 Units by mouth daily.     No current facility-administered medications for this encounter.     Physical Findings. Patient is alert and oriented.  height is 5\' 2"  (1.575 m) and weight is 120 lb 6.4 oz (54.6 kg). Her blood  pressure is 127/84 and her pulse is 72. Her oxygen saturation is 100%.     Satisfactory skin healing in radiotherapy fields. She has a little bit of residual hyperpigmentation over the left breast.    Lab Findings: Lab Results  Component Value Date   WBC 7.6 09/06/2017   HGB 12.6 09/06/2017   HCT 38.1 09/06/2017   MCV 90.9 09/06/2017   PLT 257 09/06/2017    Radiographic Findings: No results found.  Impression/Plan: Healing well from radiotherapy to the breast tissue.  Continue skin care with topical Vitamin E Oil and / or lotion for at least 2 more months for further healing.  I encouraged her to continue with yearly mammography as appropriate (for intact breast tissue) and followup with medical oncology. I will see her back on an as-needed basis. I have encouraged her to call if she has any issues or concerns in the future. I wished her the very best.    _____________________________________   Eppie Gibson, MD  This document serves as a record of services personally performed by Eppie Gibson, MD. It was created on her behalf by Rae Lips, a trained medical scribe. The creation of this record is based on the scribe's personal observations and the provider's statements to them. This document has been checked and approved by the attending provider.

## 2017-12-24 NOTE — Progress Notes (Signed)
Barbara Ferguson presents for follow up of radiation completed 11/22/17 to her Left Breast. She denies pain. She has mild fatigue in the afternoons. She has hyperpigmentation present to her Left Breast. She continues to use Radiaplex 1-2 times daily. She will begin using a vitamin E cream when the radiaplex is completed. She is not taking anti-estrogen pills, and will see Dr. Jana Hakim next on 08/01/18.   BP 127/84   Pulse 72   Ht 5\' 2"  (1.575 m)   Wt 120 lb 6.4 oz (54.6 kg)   SpO2 100% Comment: room air  BMI 22.02 kg/m    Wt Readings from Last 3 Encounters:  12/24/17 120 lb 6.4 oz (54.6 kg)  11/16/17 125 lb (56.7 kg)  11/15/17 121 lb 6.4 oz (55.1 kg)

## 2018-07-18 ENCOUNTER — Other Ambulatory Visit: Payer: Self-pay | Admitting: Oncology

## 2018-07-18 ENCOUNTER — Other Ambulatory Visit: Payer: Self-pay | Admitting: Internal Medicine

## 2018-07-18 DIAGNOSIS — Z853 Personal history of malignant neoplasm of breast: Secondary | ICD-10-CM

## 2018-07-25 ENCOUNTER — Ambulatory Visit
Admission: RE | Admit: 2018-07-25 | Discharge: 2018-07-25 | Disposition: A | Discharge status: Home / Self Care | Payer: Medicare Other | Source: Ambulatory Visit | Attending: Oncology | Admitting: Oncology

## 2018-07-25 DIAGNOSIS — Z853 Personal history of malignant neoplasm of breast: Secondary | ICD-10-CM

## 2018-07-25 HISTORY — DX: Personal history of irradiation: Z92.3

## 2018-07-31 NOTE — Progress Notes (Signed)
[facility]  [phone] [phone]     ID: Barbara Ferguson DOB: [birth year]  MR#: [id number]  EHO#:[id number]  Patient Care Team: Leeroy Cha, MD as PCP - General (Internal Medicine) Eppie Gibson, MD as Attending Physician (Radiation Oncology) Kyona Chauncey, Virgie Dad, MD as Consulting Physician (Oncology) Martinique, Amy, MD as Consulting Physician (Dermatology) Vickey Huger, MD as Consulting Physician (Orthopedic Surgery) Alphonsa Overall, MD as Consulting Physician (General Surgery) OTHER MD:  CHIEF COMPLAINT: Ductal carcinoma in situ, estrogen receptor positive  CURRENT TREATMENT: Observation   HISTORY OF CURRENT ILLNESS: From the original intake note:  "Barbara Ferguson" had screening mammography 10/31/2013 at [facility] with results showing: An area of suspicious calcifications in the left breast.. On 12/08/2013, she had a unilateral left mammography at [facility] with results showing: Breast density category D. Likely benign calcifications in the inner left breast, middle 1/3. At this visit, it was recommended that the patient follow up in 6 months for a diagnostic left mammogram with spot magnifications.  However the patient did not show for follow-up  More recently bilateral diagnostic mammography with tomography at [facility]  07/19/2017 showed increasing indeterminate calcifications within the lower slightly inner left breast. No mammographic evidence of right breast malignancy. Biopsy of the breast lesion in question on 07/23/2017 showed ([id number]) ductal carcinoma in situ, grade 2 to 3. Suspicious for focal microscopic invasion. Prognostic indicators showed: ER 100% positive with strong staining intensity and PR with 90% positive with strong staining intensity.   The patient's subsequent history is as detailed below.  INTERVAL HISTORY: Barbara Ferguson returns today for follow up and treatment of her estrogen receptor positive DCIS. She has been  doing well overall.   She completed radiation treatment consistent of the following dates: 10/26/17 - 11/22/17. She was treated to the site/dose of: 1)Left breast treated to 40.05 Gy with 15 fx of 2.67 Gy and 2) boost of 10 Gy with 5 fx of 2 Gy with Radiation Oncologist, Dr. Isidore Moos.   Since her last visit to the office, she had a diagnostic bilateral mammogram completed at [facility] on 07/25/2018 that showed: Breast density category C. Expected post lumpectomy changes to left breast.     REVIEW OF SYSTEMS: Barbara Ferguson reports that for exercise, she plays tennis and pickleball, and she is able to play for extended hours, however, she does note that her hip starts to hurt after prolonged use. She will be singing at [facility] [employer] [employer] in the near future with her church's choir. Her family is doing well overall. She denies unusual headaches, visual changes, nausea, vomiting, or dizziness. There has been no unusual cough, phlegm production, or pleurisy. This been no change in bowel or bladder habits. She denies unexplained fatigue or unexplained weight loss, bleeding, rash, or fever. A detailed review of systems was otherwise stable.     PAST MEDICAL HISTORY: Past Medical History:  Diagnosis Date  . Arthritis   . 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 Family history of breast cancer   . Family history of colon cancer   . History of colon polyps    benign  . History of kidney stones   . History of radiation therapy 10/26/17- 11/22/17   Left Breast treated to 40.05 Gy with 15 fx of 2.67 gy and boost of 10 Gy with 5 fx of 2 Gy  . Hypothyroidism    takes SYnthroid daily  . Joint pain   . Joint swelling   .  Personal history of radiation therapy   . PONV (postoperative nausea and vomiting)     PAST SURGICAL HISTORY: Past Surgical History:  Procedure Laterality Date  . BREAST LUMPECTOMY    . BREAST LUMPECTOMY WITH RADIOACTIVE SEED AND SENTINEL LYMPH NODE BIOPSY Left 09/10/2017    Procedure: LEFT BREAST LUMPECTOMY WITH RADIOACTIVE SEED AND LEFT AXILLARY SENTINEL LYMPH NODE BIOPSY;  Surgeon: Alphonsa Overall, MD;  Location: [facility];  Service: General;  Laterality: Left;  . COLONOSCOPY WITH PROPOFOL N/A 09/04/2014   Procedure: COLONOSCOPY WITH PROPOFOL;  Surgeon: Garlan Fair, MD;  Location: [facility];  Service: Endoscopy;  Laterality: N/A;  . DILATION AND CURETTAGE OF UTERUS    . 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 TONSILLECTOMY    . TOTAL KNEE ARTHROPLASTY Left 01/20/2016   Procedure: TOTAL KNEE ARTHROPLASTY;  Surgeon: Vickey Huger, MD;  Location: [facility];  Service: Orthopedics;  Laterality: Left;  . TOTAL KNEE ARTHROPLASTY Right 06/01/2016   Procedure: TOTAL KNEE ARTHROPLASTY;  Surgeon: Vickey Huger, MD;  Location: [facility];  Service: Orthopedics;  Laterality: Right;    FAMILY HISTORY Family History  Problem Relation Age of Onset  . Breast cancer Sister 83       had Genetic testing in ~2016, reportedly negative  . Stroke Mother   . Colon cancer Maternal Grandmother 16  . Heart disease Maternal Grandfather   Her father died at age 61 from old age and her mother died at age 80 from a stroke. She has one brother and one sister. Her sister was diagnosed at age 59 with breast cancer.    GYNECOLOGIC HISTORY:  No LMP recorded. Patient is postmenopausal. Menarche: 72 years old Age at first live birth: 72 years old GP: GXP2 LMP: March 1996  Contraceptive: Yes, OCP HRT: No    SOCIAL HISTORY: She is a retired Copywriter, advertising. Her husband Wille Glaser is retired from Biomedical engineer. She has two children, [name] and Micheala. [name] is a Civil engineer, contracting living in [place].  Faith Rogue is a Radio producer in [place].  The patient has 3 grandchildren. She belongs to [employer].  Incidentally the patient is the mother of a close friend of our physical therapist Raeanne Gathers     ADVANCED DIRECTIVES: Yes, her husband Wille Glaser is her Press photographer.    HEALTH  MAINTENANCE: Social History   Tobacco Use  . Smoking status: Former Smoker    Last attempt to quit: 08/04/1966    Years since quitting: 52.0  . Smokeless tobacco: Never Used  . Tobacco comment: smoked for 1 year  Substance Use Topics  . Alcohol use: Yes    Comment: rare alcohol use  . Drug use: No     Colonoscopy: Yes, completed through [facility]  PAP:  Bone density: Her prior bone density scan resulted with osteopenia per patient. She is due for another one.    No Known Allergies  Current Outpatient Medications  Medication Sig Dispense Refill  . cholecalciferol (VITAMIN D) 1000 units tablet Take 1,000 Units by mouth daily.    Marland Kitchen glucosamine-chondroitin 500-400 MG tablet Take 1 tablet by mouth daily.     Marland Kitchen levothyroxine (SYNTHROID, LEVOTHROID) 75 MCG tablet Take 75 mcg by mouth daily before breakfast.    . Multiple Vitamins-Minerals (MULTIVITAMIN & MINERAL PO) Take 1 tablet by mouth daily.     No current facility-administered medications for this visit.     OBJECTIVE: Middle-aged white woman who appears well  Vitals:   08/01/18 1354  BP: 124/73  Pulse: (!) 58  Resp: 18  Temp: 97.8 F (36.6 C)  SpO2: 100%     Body mass index is 21.91 kg/m.   Wt Readings from Last 3 Encounters:  08/01/18 119 lb 12.8 oz (54.3 kg)  12/24/17 120 lb 6.4 oz (54.6 kg)  11/16/17 125 lb (56.7 kg)      ECOG FS:1 - Symptomatic but completely ambulatory  Sclerae unicteric, pupils round and equal No cervical or supraclavicular adenopathy Lungs no rales or rhonchi Heart regular rate and rhythm Abd soft, nontender, positive bowel sounds MSK no focal spinal tenderness, no upper extremity lymphedema; careful palpation of the rib cage in the area under the left breast shows a slight irregularity which apparently sometimes is a little tender; there is some soft tissue scarring in that area from the earlier surgery Neuro: nonfocal, well oriented, appropriate affect Breasts: The right breast  is unremarkable.  The left breast has undergone lumpectomy and radiation.  There is no evidence of disease recurrence.  Both axillae are benign.  LAB RESULTS:  CMP     Component Value Date/Time   NA 138 08/04/2017 0846   K 4.3 08/04/2017 0846   CL 104 06/02/2016 0500   CO2 24 08/04/2017 0846   GLUCOSE 85 08/04/2017 0846   BUN 16.9 08/04/2017 0846   CREATININE 1.2 (H) 08/04/2017 0846   CALCIUM 10.1 08/04/2017 0846   PROT 7.2 08/04/2017 0846   ALBUMIN 4.3 08/04/2017 0846   AST 25 08/04/2017 0846   ALT 22 08/04/2017 0846   ALKPHOS 82 08/04/2017 0846   BILITOT 0.82 08/04/2017 0846   GFRNONAA 50 (L) 06/02/2016 0500   GFRAA 58 (L) 06/02/2016 0500    No results found for: TOTALPROTELP, ALBUMINELP, A1GS, A2GS, BETS, BETA2SER, GAMS, MSPIKE, SPEI  No results found for: KPAFRELGTCHN, LAMBDASER, KAPLAMBRATIO  Lab Results  Component Value Date   WBC 7.6 09/06/2017   NEUTROABS 4.0 08/04/2017   HGB 12.6 09/06/2017   HCT 38.1 09/06/2017   MCV 90.9 09/06/2017   PLT 257 09/06/2017    @LASTCHEMISTRY @  No results found for: LABCA2  No components found for: [id number]  No results for input(s): INR in the last 168 hours.  No results found for: LABCA2  No results found for: QPY195  No results found for: KDT267  No results found for: TIW580  No results found for: CA2729  No components found for: HGQUANT  No results found for: CEA1 / No results found for: CEA1   No results found for: AFPTUMOR  No results found for: CHROMOGRNA  No results found for: PSA1  No visits with results within 3 Day(s) from this visit.  Latest known visit with results is:  Hospital Outpatient Visit on 09/06/2017  Component Date Value Ref Range Status  . WBC 09/06/2017 7.6  4.0 - 10.5 K/uL Final  . RBC 09/06/2017 4.19  3.87 - 5.11 MIL/uL Final  . Hemoglobin 09/06/2017 12.6  12.0 - 15.0 g/dL Final  . HCT 09/06/2017 38.1  36.0 - 46.0 % Final  . MCV 09/06/2017 90.9  78.0 - 100.0 fL Final  . MCH  09/06/2017 30.1  26.0 - 34.0 pg Final  . MCHC 09/06/2017 33.1  30.0 - 36.0 g/dL Final  . RDW 09/06/2017 14.3  11.5 - 15.5 % Final  . Platelets 09/06/2017 257  150 - 400 K/uL Final    (this displays the last labs from the last 3 days)  No results found for: TOTALPROTELP, ALBUMINELP, A1GS, A2GS, BETS, BETA2SER,  GAMS, MSPIKE, SPEI (this displays SPEP labs)  No results found for: KPAFRELGTCHN, LAMBDASER, KAPLAMBRATIO (kappa/lambda light chains)  No results found for: HGBA, HGBA2QUANT, HGBFQUANT, HGBSQUAN (Hemoglobinopathy evaluation)   No results found for: LDH  No results found for: IRON, TIBC, IRONPCTSAT (Iron and TIBC)  No results found for: FERRITIN  Urinalysis    Component Value Date/Time   COLORURINE YELLOW 05/21/2016 Joy 05/21/2016 1055   LABSPEC 1.017 05/21/2016 1055   PHURINE 5.0 05/21/2016 1055   GLUCOSEU NEGATIVE 05/21/2016 1055   HGBUR NEGATIVE 05/21/2016 1055   BILIRUBINUR NEGATIVE 05/21/2016 1055   KETONESUR NEGATIVE 05/21/2016 1055   PROTEINUR NEGATIVE 05/21/2016 1055   NITRITE NEGATIVE 05/21/2016 1055   LEUKOCYTESUR SMALL (A) 05/21/2016 1055     STUDIES: Mm Diag Breast Tomo Bilateral  Result Date: 07/25/2018 CLINICAL DATA:  Patient presents for bilateral diagnostic examination due to a history of a previous left malignant lumpectomy December 2018. EXAM: DIGITAL DIAGNOSTIC BILATERAL MAMMOGRAM WITH CAD AND TOMO COMPARISON:  Previous exam(s). ACR Breast Density Category c: The breast tissue is heterogeneously dense, which may obscure small masses. FINDINGS: Examination demonstrates expected post lumpectomy changes over the inner lower left breast. Remainder of the exam is unchanged. Mammographic images were processed with CAD. IMPRESSION: Expected post lumpectomy changes left breast. RECOMMENDATION: Recommend continued annual bilateral diagnostic mammographic evaluation. I have discussed the findings and recommendations with the patient.  Results were also provided in writing at the conclusion of the visit. If applicable, a reminder letter will be sent to the patient regarding the next appointment. BI-RADS CATEGORY  2: Benign. Electronically Signed   By: Marin Olp M.D.   On: 07/25/2018 13:56    ELIGIBLE FOR AVAILABLE RESEARCH PROTOCOL: No  ASSESSMENT: 72 y.o. [place] woman status post left breast lower inner quadrant biopsy July 23, 2017 for ductal carcinoma in situ, grade 2 or 3, with areas suspicious for microinvasion; the tumor being estrogen and progesterone receptor positive  (1) breast conserving surgery with sentinel lymph node sampling 09/10/2017 confirmed pTis pN0, stage 0 DCIS, grade 2, with 4 axillary nodes negative and clear margins  (2) adjuvant radiation 10/26/2017 - 11/22/2017 Site/dose: 1. Left Breast / 40.05 Gy in 15 fractions of 2.67 Gy   2.Left Breast Boost / 10 Gy in 5 fractions of 2 Gy  (3) considered antiestrogens but decided against them  (a) bone density at [facility] 02/21/2015 showed a T score of -1.9  PLAN: Barbara Ferguson is now just about a year out from definitive surgery for her breast cancer with no evidence of disease recurrence or activity.  This is favorable.  In noninvasive breast cancer, which is not life-threatening, I offer patients one visit about a year out from their surgery to make sure they do not have any questions or unusual concerns.  That was today's visit.  She has no questions or concerns.  In fact she says she is "normal".  This is a real achievement and I congratulated her on getting back into her life as so quickly  At this point I feel comfortable releasing her to her primary care physician.  All she will need in terms of breast cancer follow-up is a yearly physician breast exam and her yearly mammography, preferably with tomography.  I will be glad to see her again at any point in the future if and when the need arises but as of now are making no further routine  appointments for her here.   Luka Stohr, Virgie Dad, MD  08/01/18 2:09 PM Medical Oncology and Hematology [facility] [address] [place], [place] [address] Tel. [phone]    Fax. [phone]    I, Soijett Blue am acting as scribe for Dr. Sarajane Jews C. Esai Stecklein.  I, Lurline Del MD, have reviewed the above documentation for accuracy and completeness, and I agree with the above.

## 2018-08-01 ENCOUNTER — Inpatient Hospital Stay: Payer: Medicare Other | Attending: Oncology | Admitting: Oncology

## 2018-08-01 VITALS — BP 124/73 | HR 58 | Temp 97.8°F | Resp 18 | Wt 119.8 lb

## 2018-08-01 DIAGNOSIS — Z86000 Personal history of in-situ neoplasm of breast: Secondary | ICD-10-CM | POA: Diagnosis not present

## 2018-08-01 DIAGNOSIS — Z923 Personal history of irradiation: Secondary | ICD-10-CM | POA: Insufficient documentation

## 2018-08-01 DIAGNOSIS — C50312 Malignant neoplasm of lower-inner quadrant of left female breast: Secondary | ICD-10-CM

## 2018-08-01 DIAGNOSIS — Z87891 Personal history of nicotine dependence: Secondary | ICD-10-CM | POA: Diagnosis not present

## 2018-08-01 DIAGNOSIS — Z170 Estrogen receptor positive status [ER+]: Secondary | ICD-10-CM | POA: Insufficient documentation

## 2018-08-01 DIAGNOSIS — D0512 Intraductal carcinoma in situ of left breast: Secondary | ICD-10-CM

## 2018-08-02 ENCOUNTER — Telehealth: Payer: Self-pay | Admitting: Oncology

## 2018-08-02 NOTE — Telephone Encounter (Signed)
Per 10/28 no los

## 2018-10-16 IMAGING — MG NEEDLE LOCALIZATION OF THE LEFT BREAST WITH MAMMO GUIDANCE
7 series · 7 of 7 positions shown · non-contrast
Comparison: Previous exam(s).

CLINICAL DATA: 71-year-old female for radioactive seed localization
of left breast DCIS prior to lumpectomy.

EXAM:
MAMMOGRAPHIC GUIDED RADIOACTIVE SEED LOCALIZATION OF THE LEFT BREAST

[L CC (1 of 4)]
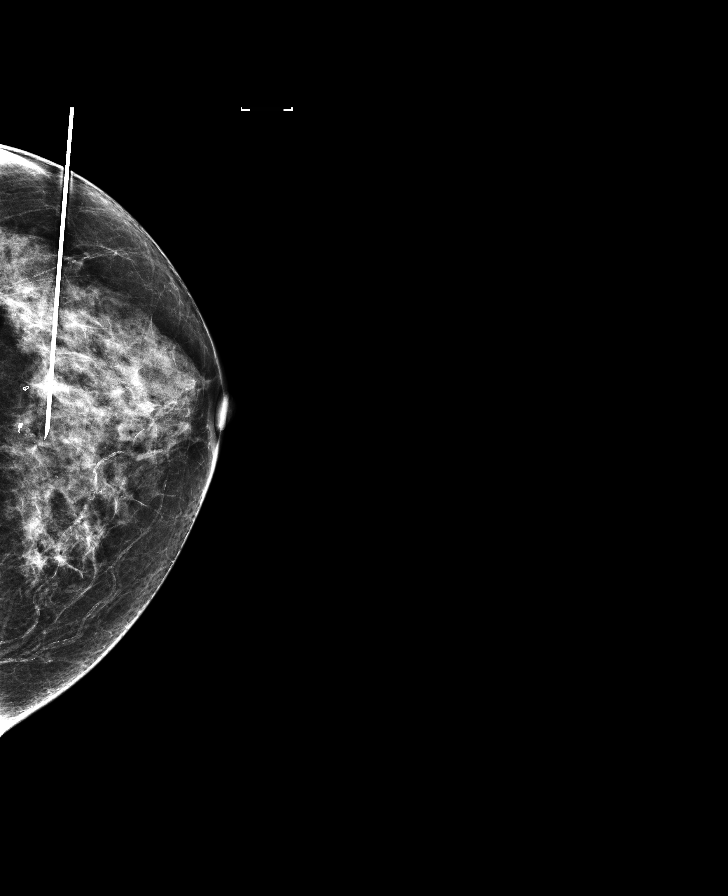

[L CC (2 of 4)]
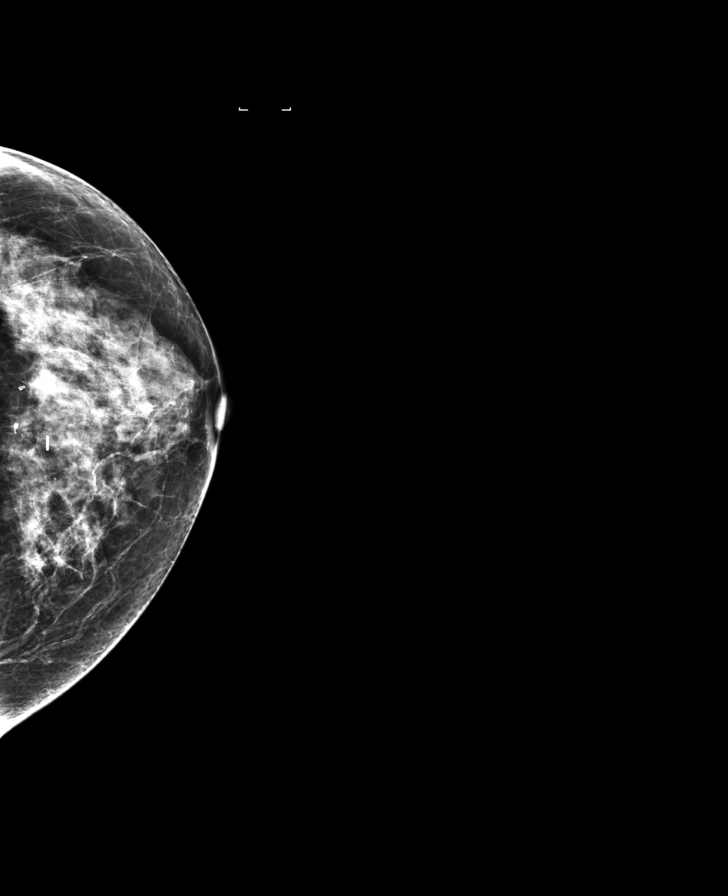

[L ML]
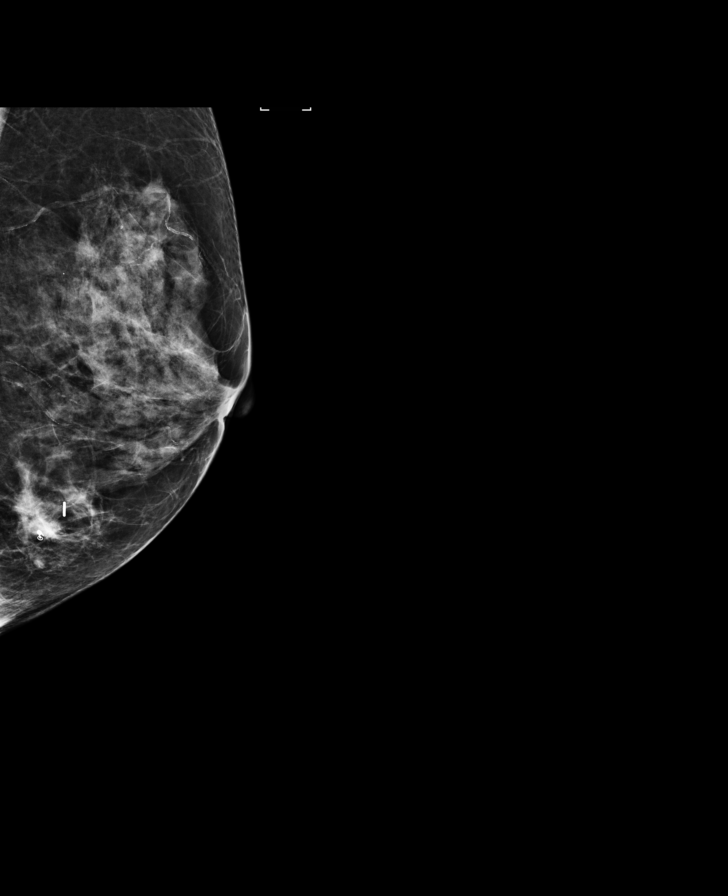

[L CC (3 of 4)]
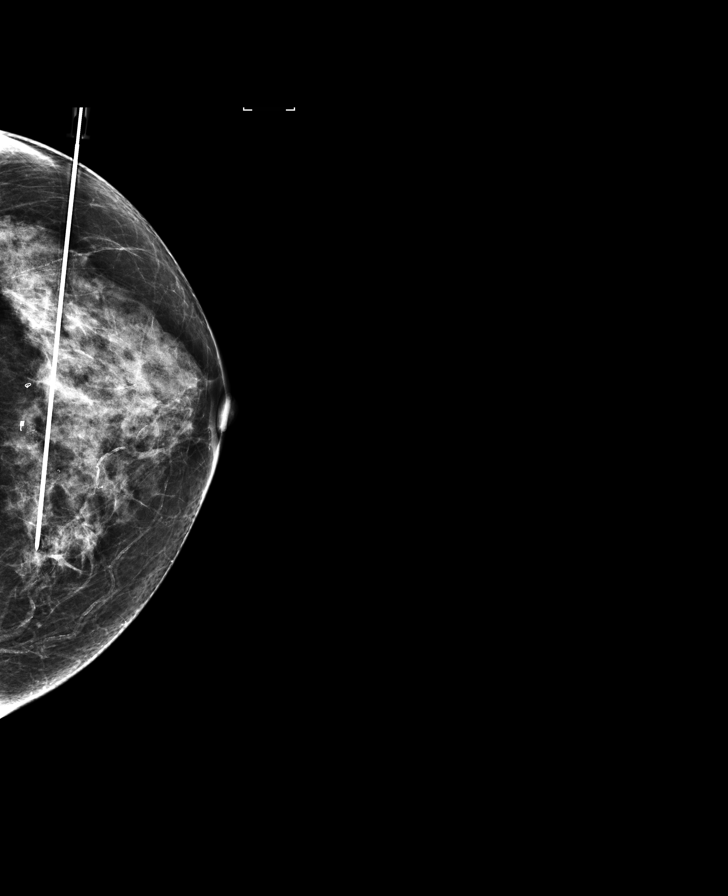

[L CC (4 of 4)]
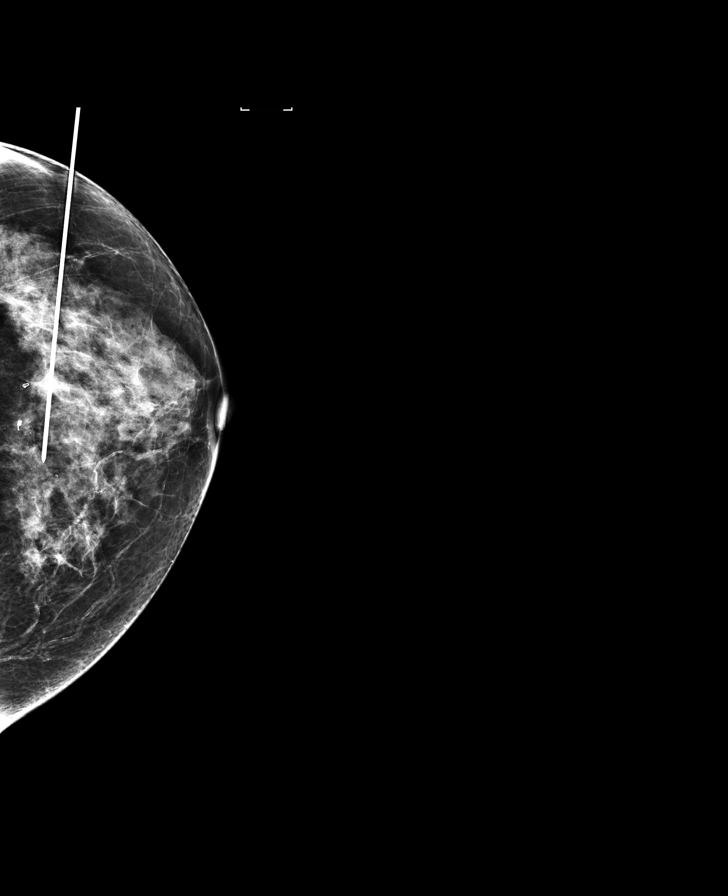

[L LM (1 of 2)]
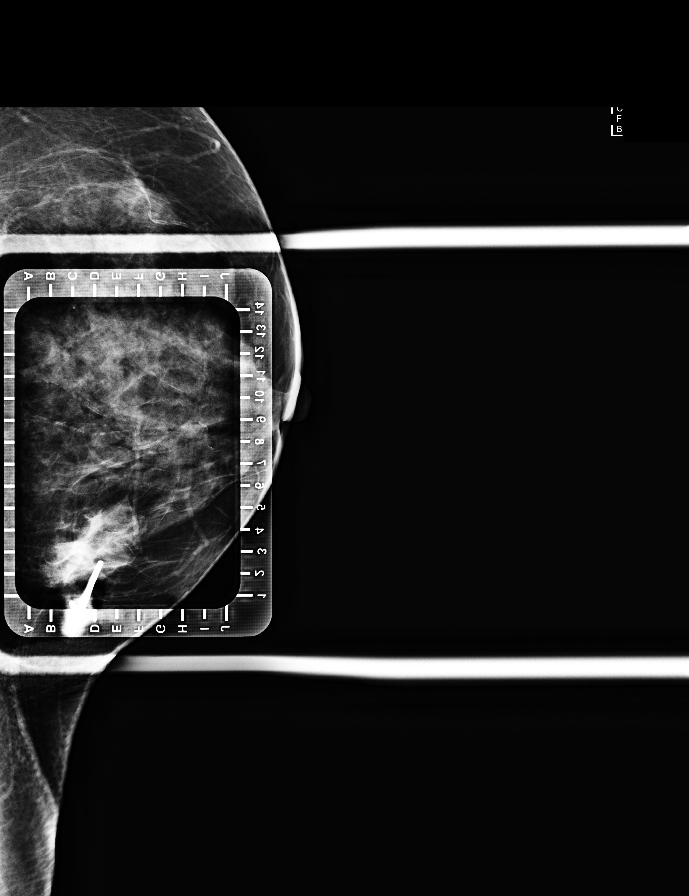

[L LM (2 of 2)]
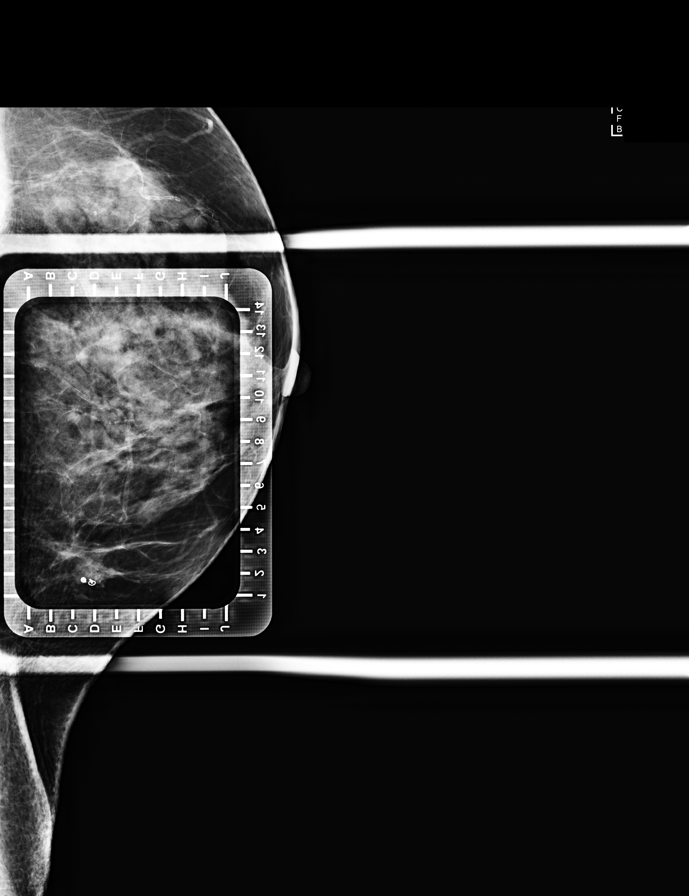

[7 of 7 positions shown; findings below may reference images not displayed]

FINDINGS: Patient presents for radioactive seed localization prior to left
lumpectomy. I met with the patient and we discussed the procedure of
seed localization including benefits and alternatives. We discussed
the high likelihood of a successful procedure. We discussed the
risks of the procedure including infection, bleeding, tissue injury
and further surgery. We discussed the low dose of radioactivity
involved in the procedure. Informed, written consent was given.

The usual time-out protocol was performed immediately prior to the
procedure.

Using mammographic guidance, sterile technique, 1% lidocaine and an
[id number] radioactive seed, the remaining calcifications were localized
using a lateral approach. The follow-up mammogram images confirm the
seed in the expected location and were marked for Dr. Mii.

Follow-up survey of the patient confirms presence of the radioactive
seed, which is located along the anterior aspect of remaining
calcifications. Calcifications and coil shaped biopsy clip are noted
up to 1 cm posterior to the seed..

Order number of C-ZL7 seed:  [id number].

Total activity:  0.246 millicurie  Reference Date: 08/20/2017

The patient tolerated the procedure well and was released from the
[REDACTED]. She was given instructions regarding seed removal.
IMPRESSION: Radioactive seed localization left breast. No apparent
complications.

Please note that the seed was placed along the anterior aspect of
remaining calcifications. Calcifications and coil shaped clip are
noted up to 1 cm posterior to the seed.

## 2018-10-18 IMAGING — MG BREAST SURGICAL SPECIMEN
1 series · 1 of 1 positions shown · non-contrast
Comparison: Previous exam(s).

CLINICAL DATA: Left breast lumpectomy for biopsy-proven DCIS with
possible focal microscopic invasion involving the lower inner
quadrant of the left breast. Radioactive seed localization was
performed 09/08/2017.

EXAM:
SPECIMEN RADIOGRAPH OF THE LEFT BREAST

[L]
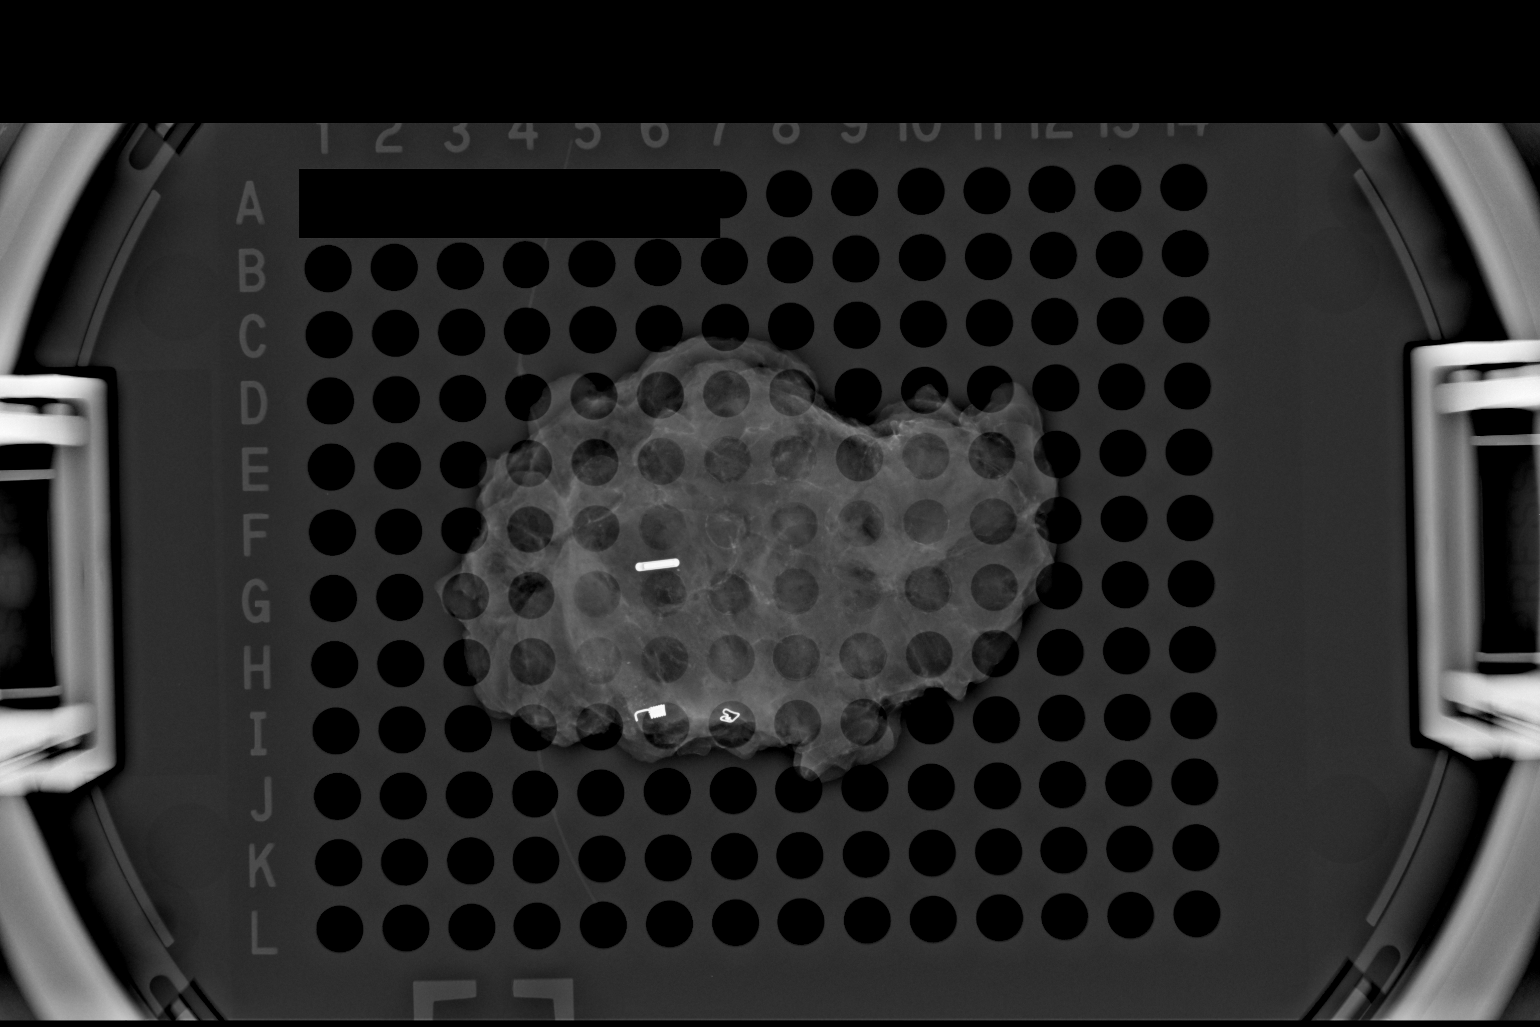

[1 of 1 positions shown; findings below may reference images not displayed]

FINDINGS: Status post excision of the left breast. The radioactive seed, the
coil shaped tissue marker clip, the heart shaped tissue marker clip
and calcifications are present, completely intact, and are marked
for pathology. This was discussed directly with Dr. Payal in the
operating room at the time of interpretation on 09/10/2017 at [DATE]
p.m..
IMPRESSION: Specimen radiograph of the left breast.

## 2019-04-27 ENCOUNTER — Other Ambulatory Visit: Payer: Self-pay

## 2019-04-27 ENCOUNTER — Encounter: Payer: Self-pay | Admitting: Sports Medicine

## 2019-04-27 ENCOUNTER — Ambulatory Visit: Payer: Medicare Other | Admitting: Sports Medicine

## 2019-04-27 VITALS — BP 126/70 | Ht 62.0 in | Wt 120.0 lb

## 2019-04-27 DIAGNOSIS — R200 Anesthesia of skin: Secondary | ICD-10-CM | POA: Diagnosis not present

## 2019-04-27 DIAGNOSIS — G5691 Unspecified mononeuropathy of right upper limb: Secondary | ICD-10-CM | POA: Diagnosis not present

## 2019-04-27 DIAGNOSIS — M25311 Other instability, right shoulder: Secondary | ICD-10-CM

## 2019-04-27 NOTE — Assessment & Plan Note (Signed)
Given exercise to improve IR and ER strength Will follow

## 2019-04-27 NOTE — Assessment & Plan Note (Signed)
We are treating this conservatively  If no response I suggested doing [place] of median nerve and also of RT shoulder Consider neck XR

## 2019-04-27 NOTE — Progress Notes (Signed)
PCP: Leeroy Cha, MD  Subjective:   HPI: Patient is a 73 y.o. female here for numbness in the tips of her right first three digits x 2-3 months. She notes she noticed numbness along her fingernail of her 3rd finger starting ~2 years ago, but has noticed now has become all three tips of fingers. She notes primarily in thumb and 3rd finger. Patient notes she has chronic shoulder and neck pain, R>L. 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 She notes she had intermittent numbness during her working years that would go away with rest. She notes if she grips things for long periods of times the fingers become more numb. She notes she plays tennis and pickle ball few times a week. Denies any radiation down arm. Denies any pain. Denies any worsening of her chronic neck/shoulder pain.She notes she primarily notices the numbness at night. Has tried wearing a splint at night during sleep a few years ago and this did not help her symptoms.   Review of Systems:  Per HPI.   [place], medications and smoking status reviewed.      Objective:  Physical Exam:  Gen: awake, alert, NAD, comfortable in exam room Pulm: breathing unlabored  Cervical Spine:  Inspection: No gross deformity, swelling, bruising.  Palpation:  No midline/bony TTP. Shoulder  ROM: Decreased ROM in external rotation bilaterally, otherwise WNL  Strength: 4/5 strength in external rotation bilaterally and only 4.5/5 on IR otherwise normal 5/5 BUE  Sensation intact to light touch.    Reflexes: 2+ equal reflexes in triceps, biceps, brachioradialis tendons.  Special Tests: Negative spurlings.  NV intact distal BUEs.  Shoulder, Right: No evidence of bony deformity, asymmetry, or muscle atrophy; No tenderness over long head of biceps (bicipital groove). No TTP at [facility] joint. Decreased external rotation (~45-60 deg) bilaterally, otherwise full ROM active and passive range of motion, Thumb to  T12 without significant tenderness. 4/5 strength with external rotation bilaterally, and 4.5/5 on IR otherwise 5/5 strength throughout. No abnormal scapular function observed. Sensation intact. Peripheral pulses intact.  Special Tests:   - Crossarm test: NEG   - Empty can: NEG   - Hawkins: NEG   - Neer test: NEG   - Obrien's test: Positive for weakness   - Speeds test: NEG   - Clunk test: Positive  Shoulder, left: No evidence of bony deformity, asymmetry, or muscle atrophy; No tenderness over long head of biceps (bicipital groove). No TTP at [facility] joint.  Decreased external rotation (~45-60 deg) bilaterally, otherwise full ROM active and passive range of motion, Thumb to T12 without significant tenderness. 4/5 strength with external rotation bilaterally, otherwise 5/5 strength throughout. No abnormal scapular function observed. Sensation intact. Peripheral pulses intact.  Special Tests:   - Crossarm test: NEG   - Empty can: NEG   - Hawkins: NEG   - Obrien's test: Positive for weakness  Hand, Right:  Inspection: No obvious deformity. No swelling, erythema or bruising  Palpation: no TTP  ROM: Full ROM of the digits and wrist  Strength: 5/5 strength in the forearm, wrist and interosseus muscles  Neurovascular: NV intact  Special tests: Positive tinel's at the carpal tunnel with reproduction of numbness at 3rd digit, Positive Phalen's with reproduction of numbness at 3rd digit   Hand, Left:   Inspection: No obvious deformity. No swelling, erythema or bruising  Palpation: no TTP  ROM: Full ROM of the digits and wrist  Strength: 5/5 strength in  the forearm, wrist and interosseus muscles  Neurovascular: NV intact  Special tests: Negative tinel's at the carpal tunnel, negative Phalen's    Assessment & Plan:  1. Peripheral Neuropathy of Right UE: Patient presents with acute on chronic right 1st-3rd finger numbness. Differential is broad and unclear at this time. It is obvious patient has  right shoulder instability on exam with subluxation during ROM and findings of infraspinatus/teres minor weakness. This instability could be leading to stretching of surrounding nerves including axillary and suprascapular nerve, however other surrounding nerves may be irritated as well. Does not appear to be cervical in origin given benign exam and lack of cervical symptoms. Carpal tunnel is possible given numbness is worse at night, however would have expected nightly splinting to have improved her symptoms in the past. Will focus on conservative treatment at this time. Consider further imaging at follow up if not improved. - home shoulder stability exercises  - Vitamin B6 50mg  QD for nerve protection - RTC 6 weeks if no improvement or worsening - Ultrasound of right wrist and shoulder at follow up if no improvement in neuropathy symptoms   2. Right Shoulder Instability: Physical exam notable for right shoulder subluxation during ROM with significant infraspinatous/teres minor weakness. This is likely chronic and does not appear to affect patient's ADLs as she has remained fairly active. This could be contributing to patient neuropathic symptoms above. No pain on exam which is reassuring for impingement or tear. Arthritic changes likely given crepitus on exam.   Mina Marble, DO [facility], PGY2 04/27/2019 4:36 PM  I observed and examined the patient with the resident and agree with assessment and plan.  Note reviewed and modified by me. Ila Mcgill, MD

## 2019-08-23 ENCOUNTER — Other Ambulatory Visit: Payer: Self-pay | Admitting: Internal Medicine

## 2019-08-23 DIAGNOSIS — Z9889 Other specified postprocedural states: Secondary | ICD-10-CM

## 2019-08-29 ENCOUNTER — Other Ambulatory Visit: Payer: Self-pay | Admitting: Internal Medicine

## 2019-08-29 DIAGNOSIS — M85852 Other specified disorders of bone density and structure, left thigh: Secondary | ICD-10-CM

## 2019-09-05 ENCOUNTER — Other Ambulatory Visit: Payer: Self-pay

## 2019-09-05 ENCOUNTER — Ambulatory Visit
Admission: RE | Admit: 2019-09-05 | Discharge: 2019-09-05 | Disposition: A | Discharge status: Home / Self Care | Payer: Medicare Other | Source: Ambulatory Visit | Attending: Internal Medicine | Admitting: Internal Medicine

## 2019-09-05 DIAGNOSIS — Z9889 Other specified postprocedural states: Secondary | ICD-10-CM

## 2019-10-12 ENCOUNTER — Other Ambulatory Visit: Payer: Self-pay

## 2019-10-12 ENCOUNTER — Ambulatory Visit
Admission: RE | Admit: 2019-10-12 | Discharge: 2019-10-12 | Disposition: A | Discharge status: Home / Self Care | Payer: Medicare Other | Source: Ambulatory Visit | Attending: Internal Medicine | Admitting: Internal Medicine

## 2019-10-12 DIAGNOSIS — M85852 Other specified disorders of bone density and structure, left thigh: Secondary | ICD-10-CM

## 2020-09-03 ENCOUNTER — Other Ambulatory Visit: Payer: Self-pay | Admitting: Internal Medicine

## 2020-09-03 DIAGNOSIS — Z853 Personal history of malignant neoplasm of breast: Secondary | ICD-10-CM

## 2020-10-11 ENCOUNTER — Other Ambulatory Visit: Payer: Self-pay

## 2020-10-11 ENCOUNTER — Ambulatory Visit
Admission: RE | Admit: 2020-10-11 | Discharge: 2020-10-11 | Disposition: A | Discharge status: Home / Self Care | Payer: Medicare Other | Source: Ambulatory Visit | Attending: Internal Medicine | Admitting: Internal Medicine

## 2020-10-11 DIAGNOSIS — Z853 Personal history of malignant neoplasm of breast: Secondary | ICD-10-CM

## 2021-04-25 DIAGNOSIS — M5013 Cervical disc disorder with radiculopathy, cervicothoracic region: Secondary | ICD-10-CM | POA: Diagnosis not present

## 2021-04-25 DIAGNOSIS — M5416 Radiculopathy, lumbar region: Secondary | ICD-10-CM | POA: Diagnosis not present

## 2021-04-25 DIAGNOSIS — M160 Bilateral primary osteoarthritis of hip: Secondary | ICD-10-CM | POA: Diagnosis not present

## 2021-05-08 DIAGNOSIS — Z96641 Presence of right artificial hip joint: Secondary | ICD-10-CM | POA: Diagnosis not present

## 2021-05-08 DIAGNOSIS — Z471 Aftercare following joint replacement surgery: Secondary | ICD-10-CM | POA: Diagnosis not present

## 2021-05-12 DIAGNOSIS — M160 Bilateral primary osteoarthritis of hip: Secondary | ICD-10-CM | POA: Diagnosis not present

## 2021-05-12 DIAGNOSIS — M5416 Radiculopathy, lumbar region: Secondary | ICD-10-CM | POA: Diagnosis not present

## 2021-05-12 DIAGNOSIS — M5013 Cervical disc disorder with radiculopathy, cervicothoracic region: Secondary | ICD-10-CM | POA: Diagnosis not present

## 2021-05-14 DIAGNOSIS — E785 Hyperlipidemia, unspecified: Secondary | ICD-10-CM | POA: Diagnosis not present

## 2021-05-27 DIAGNOSIS — M5013 Cervical disc disorder with radiculopathy, cervicothoracic region: Secondary | ICD-10-CM | POA: Diagnosis not present

## 2021-05-27 DIAGNOSIS — M160 Bilateral primary osteoarthritis of hip: Secondary | ICD-10-CM | POA: Diagnosis not present

## 2021-05-27 DIAGNOSIS — M5416 Radiculopathy, lumbar region: Secondary | ICD-10-CM | POA: Diagnosis not present

## 2021-09-09 DIAGNOSIS — Z0000 Encounter for general adult medical examination without abnormal findings: Secondary | ICD-10-CM | POA: Diagnosis not present

## 2021-09-09 DIAGNOSIS — Z1389 Encounter for screening for other disorder: Secondary | ICD-10-CM | POA: Diagnosis not present

## 2021-09-09 DIAGNOSIS — D0512 Intraductal carcinoma in situ of left breast: Secondary | ICD-10-CM | POA: Diagnosis not present

## 2021-09-09 DIAGNOSIS — Z1231 Encounter for screening mammogram for malignant neoplasm of breast: Secondary | ICD-10-CM | POA: Diagnosis not present

## 2021-09-09 DIAGNOSIS — E785 Hyperlipidemia, unspecified: Secondary | ICD-10-CM | POA: Diagnosis not present

## 2021-09-09 DIAGNOSIS — M8588 Other specified disorders of bone density and structure, other site: Secondary | ICD-10-CM | POA: Diagnosis not present

## 2021-09-15 ENCOUNTER — Other Ambulatory Visit: Payer: Self-pay | Admitting: Internal Medicine

## 2021-09-15 DIAGNOSIS — M8588 Other specified disorders of bone density and structure, other site: Secondary | ICD-10-CM

## 2021-10-07 DIAGNOSIS — D225 Melanocytic nevi of trunk: Secondary | ICD-10-CM | POA: Diagnosis not present

## 2021-10-07 DIAGNOSIS — Z85828 Personal history of other malignant neoplasm of skin: Secondary | ICD-10-CM | POA: Diagnosis not present

## 2021-10-07 DIAGNOSIS — L814 Other melanin hyperpigmentation: Secondary | ICD-10-CM | POA: Diagnosis not present

## 2021-10-07 DIAGNOSIS — D2271 Melanocytic nevi of right lower limb, including hip: Secondary | ICD-10-CM | POA: Diagnosis not present

## 2021-10-07 DIAGNOSIS — L853 Xerosis cutis: Secondary | ICD-10-CM | POA: Diagnosis not present

## 2021-10-07 DIAGNOSIS — D2239 Melanocytic nevi of other parts of face: Secondary | ICD-10-CM | POA: Diagnosis not present

## 2021-10-07 DIAGNOSIS — L720 Epidermal cyst: Secondary | ICD-10-CM | POA: Diagnosis not present

## 2021-10-07 DIAGNOSIS — D1801 Hemangioma of skin and subcutaneous tissue: Secondary | ICD-10-CM | POA: Diagnosis not present

## 2021-10-07 DIAGNOSIS — L218 Other seborrheic dermatitis: Secondary | ICD-10-CM | POA: Diagnosis not present

## 2021-10-15 ENCOUNTER — Other Ambulatory Visit: Payer: Self-pay | Admitting: Internal Medicine

## 2021-10-15 DIAGNOSIS — Z1231 Encounter for screening mammogram for malignant neoplasm of breast: Secondary | ICD-10-CM

## 2021-10-17 ENCOUNTER — Other Ambulatory Visit: Payer: Self-pay

## 2021-10-17 ENCOUNTER — Ambulatory Visit
Admission: RE | Admit: 2021-10-17 | Discharge: 2021-10-17 | Disposition: A | Discharge status: Home / Self Care | Payer: Medicare Other | Source: Ambulatory Visit | Attending: Internal Medicine | Admitting: Internal Medicine

## 2021-10-17 DIAGNOSIS — Z1231 Encounter for screening mammogram for malignant neoplasm of breast: Secondary | ICD-10-CM | POA: Diagnosis not present

## 2021-10-27 DIAGNOSIS — Z85828 Personal history of other malignant neoplasm of skin: Secondary | ICD-10-CM | POA: Diagnosis not present

## 2021-10-27 DIAGNOSIS — L720 Epidermal cyst: Secondary | ICD-10-CM | POA: Diagnosis not present

## 2021-12-22 DIAGNOSIS — L720 Epidermal cyst: Secondary | ICD-10-CM | POA: Diagnosis not present

## 2022-03-10 ENCOUNTER — Ambulatory Visit
Admission: RE | Admit: 2022-03-10 | Discharge: 2022-03-10 | Disposition: A | Discharge status: Home / Self Care | Payer: Medicare Other | Source: Ambulatory Visit | Attending: Internal Medicine | Admitting: Internal Medicine

## 2022-03-10 ENCOUNTER — Other Ambulatory Visit: Payer: Self-pay | Admitting: Internal Medicine

## 2022-03-10 DIAGNOSIS — E785 Hyperlipidemia, unspecified: Secondary | ICD-10-CM | POA: Diagnosis not present

## 2022-03-10 DIAGNOSIS — M810 Age-related osteoporosis without current pathological fracture: Secondary | ICD-10-CM | POA: Diagnosis not present

## 2022-03-10 DIAGNOSIS — M8588 Other specified disorders of bone density and structure, other site: Secondary | ICD-10-CM

## 2022-03-10 DIAGNOSIS — Z780 Asymptomatic menopausal state: Secondary | ICD-10-CM | POA: Diagnosis not present

## 2022-03-10 DIAGNOSIS — N1831 Chronic kidney disease, stage 3a: Secondary | ICD-10-CM | POA: Diagnosis not present

## 2022-03-10 DIAGNOSIS — D0512 Intraductal carcinoma in situ of left breast: Secondary | ICD-10-CM | POA: Diagnosis not present

## 2022-03-10 DIAGNOSIS — M1610 Unilateral primary osteoarthritis, unspecified hip: Secondary | ICD-10-CM | POA: Diagnosis not present

## 2022-03-10 DIAGNOSIS — M85859 Other specified disorders of bone density and structure, unspecified thigh: Secondary | ICD-10-CM | POA: Diagnosis not present

## 2022-03-10 DIAGNOSIS — M85852 Other specified disorders of bone density and structure, left thigh: Secondary | ICD-10-CM | POA: Diagnosis not present

## 2022-03-25 DIAGNOSIS — H2511 Age-related nuclear cataract, right eye: Secondary | ICD-10-CM | POA: Diagnosis not present

## 2022-03-25 DIAGNOSIS — H52223 Regular astigmatism, bilateral: Secondary | ICD-10-CM | POA: Diagnosis not present

## 2022-03-25 DIAGNOSIS — H04123 Dry eye syndrome of bilateral lacrimal glands: Secondary | ICD-10-CM | POA: Diagnosis not present

## 2022-03-25 DIAGNOSIS — H524 Presbyopia: Secondary | ICD-10-CM | POA: Diagnosis not present

## 2022-04-03 DIAGNOSIS — Z96653 Presence of artificial knee joint, bilateral: Secondary | ICD-10-CM | POA: Diagnosis not present

## 2022-04-03 DIAGNOSIS — Z96641 Presence of right artificial hip joint: Secondary | ICD-10-CM | POA: Diagnosis not present

## 2022-04-15 DIAGNOSIS — M25512 Pain in left shoulder: Secondary | ICD-10-CM | POA: Diagnosis not present

## 2022-04-15 DIAGNOSIS — M25511 Pain in right shoulder: Secondary | ICD-10-CM | POA: Diagnosis not present

## 2022-04-15 DIAGNOSIS — M79672 Pain in left foot: Secondary | ICD-10-CM | POA: Diagnosis not present

## 2022-04-15 DIAGNOSIS — M25562 Pain in left knee: Secondary | ICD-10-CM | POA: Diagnosis not present

## 2022-04-28 ENCOUNTER — Other Ambulatory Visit: Payer: Medicare Other

## 2022-05-06 DIAGNOSIS — M79672 Pain in left foot: Secondary | ICD-10-CM | POA: Diagnosis not present

## 2022-05-06 DIAGNOSIS — M25511 Pain in right shoulder: Secondary | ICD-10-CM | POA: Diagnosis not present

## 2022-05-06 DIAGNOSIS — M25512 Pain in left shoulder: Secondary | ICD-10-CM | POA: Diagnosis not present

## 2022-05-06 DIAGNOSIS — M25562 Pain in left knee: Secondary | ICD-10-CM | POA: Diagnosis not present

## 2022-05-13 DIAGNOSIS — D0512 Intraductal carcinoma in situ of left breast: Secondary | ICD-10-CM | POA: Diagnosis not present

## 2022-05-13 DIAGNOSIS — N1831 Chronic kidney disease, stage 3a: Secondary | ICD-10-CM | POA: Diagnosis not present

## 2022-05-13 DIAGNOSIS — N183 Chronic kidney disease, stage 3 unspecified: Secondary | ICD-10-CM | POA: Diagnosis not present

## 2022-05-13 DIAGNOSIS — E039 Hypothyroidism, unspecified: Secondary | ICD-10-CM | POA: Diagnosis not present

## 2022-05-13 DIAGNOSIS — E785 Hyperlipidemia, unspecified: Secondary | ICD-10-CM | POA: Diagnosis not present

## 2022-05-18 DIAGNOSIS — M25512 Pain in left shoulder: Secondary | ICD-10-CM | POA: Diagnosis not present

## 2022-05-18 DIAGNOSIS — M25562 Pain in left knee: Secondary | ICD-10-CM | POA: Diagnosis not present

## 2022-05-18 DIAGNOSIS — M79672 Pain in left foot: Secondary | ICD-10-CM | POA: Diagnosis not present

## 2022-05-18 DIAGNOSIS — M25511 Pain in right shoulder: Secondary | ICD-10-CM | POA: Diagnosis not present

## 2022-06-30 ENCOUNTER — Ambulatory Visit: Payer: Medicare Other | Admitting: Sports Medicine

## 2022-06-30 DIAGNOSIS — M21612 Bunion of left foot: Secondary | ICD-10-CM | POA: Diagnosis not present

## 2022-06-30 DIAGNOSIS — M19079 Primary osteoarthritis, unspecified ankle and foot: Secondary | ICD-10-CM | POA: Diagnosis not present

## 2022-06-30 DIAGNOSIS — M21611 Bunion of right foot: Secondary | ICD-10-CM | POA: Diagnosis not present

## 2022-06-30 NOTE — Assessment & Plan Note (Signed)
She will need to continue to use good arch support even in nonsupportive shoes so as to not worsen the symptoms across the mid and forefoot

## 2022-06-30 NOTE — Assessment & Plan Note (Signed)
Custom orthotics made today  We may need to add additional cushion under the bunions However her walking gait was improved and she was very comfortable We did add hammertoe pads to help keep the second toe from being displaced She will try these and follow-up pending response

## 2022-06-30 NOTE — Progress Notes (Signed)
Chief complaint bilateral bunions and foot pain  Patient continues to exercise regularly She walks and does yoga She plays pickle ball She gets bilateral foot pain particularly after playing pickle ball on a harder surface She has tried shoe changes and over-the-counter orthotics These measures helped somewhat However her pain is getting worse and starting to limit her activity She comes to me sent by another patient Lindon Romp to ask about custom orthotics  Physical exam is a pleasant older female BP 132/70   Ht '5\' 2"'$  (1.575 m)   Wt 125 lb (56.7 kg)   BMI 22.86 kg/m   On examination she has bilateral midfoot tarsometatarsal bossing consistent with some midfoot arthritis The long arch has dropped moderately on both sides She has a bunion with hallux valgus shift bilaterally This is starting to displace the second toe upward and causing some hammering right worse than left On walking gait she is pronated  Patient was fitted for a : standard, cushioned, semi-rigid orthotic. The orthotic was heated and afterward the patient stood on the orthotic blank positioned on the orthotic stand. The patient was positioned in subtalar neutral position and 10 degrees of ankle dorsiflexion in a weight bearing stance. After completion of molding, a stable base was applied to the orthotic blank. The blank was ground to a stable position for weight bearing. Size: 7 red cambray med. EVA Base: Blue med. EVA Posting: none Additional orthotic padding: - given hammer toe pads bilaterall

## 2022-07-06 ENCOUNTER — Ambulatory Visit
Admission: RE | Admit: 2022-07-06 | Discharge: 2022-07-06 | Disposition: A | Discharge status: Home / Self Care | Payer: No Typology Code available for payment source | Source: Ambulatory Visit | Attending: Internal Medicine | Admitting: Internal Medicine

## 2022-07-06 DIAGNOSIS — E785 Hyperlipidemia, unspecified: Secondary | ICD-10-CM | POA: Diagnosis not present

## 2022-09-15 ENCOUNTER — Other Ambulatory Visit: Payer: Self-pay | Admitting: Internal Medicine

## 2022-09-15 DIAGNOSIS — E039 Hypothyroidism, unspecified: Secondary | ICD-10-CM | POA: Diagnosis not present

## 2022-09-15 DIAGNOSIS — Z86010 Personal history of colonic polyps: Secondary | ICD-10-CM | POA: Diagnosis not present

## 2022-09-15 DIAGNOSIS — M1610 Unilateral primary osteoarthritis, unspecified hip: Secondary | ICD-10-CM | POA: Diagnosis not present

## 2022-09-15 DIAGNOSIS — E785 Hyperlipidemia, unspecified: Secondary | ICD-10-CM | POA: Diagnosis not present

## 2022-09-15 DIAGNOSIS — N183 Chronic kidney disease, stage 3 unspecified: Secondary | ICD-10-CM | POA: Diagnosis not present

## 2022-09-15 DIAGNOSIS — I2510 Atherosclerotic heart disease of native coronary artery without angina pectoris: Secondary | ICD-10-CM | POA: Diagnosis not present

## 2022-09-15 DIAGNOSIS — Z1231 Encounter for screening mammogram for malignant neoplasm of breast: Secondary | ICD-10-CM

## 2022-09-15 DIAGNOSIS — Z0000 Encounter for general adult medical examination without abnormal findings: Secondary | ICD-10-CM | POA: Diagnosis not present

## 2022-09-15 DIAGNOSIS — M810 Age-related osteoporosis without current pathological fracture: Secondary | ICD-10-CM | POA: Diagnosis not present

## 2022-09-15 DIAGNOSIS — D0512 Intraductal carcinoma in situ of left breast: Secondary | ICD-10-CM | POA: Diagnosis not present

## 2022-09-24 DIAGNOSIS — M7631 Iliotibial band syndrome, right leg: Secondary | ICD-10-CM | POA: Diagnosis not present

## 2022-10-06 DIAGNOSIS — D2271 Melanocytic nevi of right lower limb, including hip: Secondary | ICD-10-CM | POA: Diagnosis not present

## 2022-10-06 DIAGNOSIS — D2261 Melanocytic nevi of right upper limb, including shoulder: Secondary | ICD-10-CM | POA: Diagnosis not present

## 2022-10-06 DIAGNOSIS — L570 Actinic keratosis: Secondary | ICD-10-CM | POA: Diagnosis not present

## 2022-10-06 DIAGNOSIS — L720 Epidermal cyst: Secondary | ICD-10-CM | POA: Diagnosis not present

## 2022-10-06 DIAGNOSIS — L821 Other seborrheic keratosis: Secondary | ICD-10-CM | POA: Diagnosis not present

## 2022-10-06 DIAGNOSIS — D225 Melanocytic nevi of trunk: Secondary | ICD-10-CM | POA: Diagnosis not present

## 2022-10-06 DIAGNOSIS — D1801 Hemangioma of skin and subcutaneous tissue: Secondary | ICD-10-CM | POA: Diagnosis not present

## 2022-10-06 DIAGNOSIS — L718 Other rosacea: Secondary | ICD-10-CM | POA: Diagnosis not present

## 2022-10-06 DIAGNOSIS — Z85828 Personal history of other malignant neoplasm of skin: Secondary | ICD-10-CM | POA: Diagnosis not present

## 2022-10-06 DIAGNOSIS — L814 Other melanin hyperpigmentation: Secondary | ICD-10-CM | POA: Diagnosis not present

## 2022-11-11 ENCOUNTER — Ambulatory Visit
Admission: RE | Admit: 2022-11-11 | Discharge: 2022-11-11 | Disposition: A | Discharge status: Home / Self Care | Payer: Medicare Other | Source: Ambulatory Visit | Attending: Internal Medicine | Admitting: Internal Medicine

## 2022-11-11 DIAGNOSIS — Z1231 Encounter for screening mammogram for malignant neoplasm of breast: Secondary | ICD-10-CM | POA: Diagnosis not present

## 2023-03-22 DIAGNOSIS — D0512 Intraductal carcinoma in situ of left breast: Secondary | ICD-10-CM | POA: Diagnosis not present

## 2023-03-22 DIAGNOSIS — E785 Hyperlipidemia, unspecified: Secondary | ICD-10-CM | POA: Diagnosis not present

## 2023-03-22 DIAGNOSIS — I2510 Atherosclerotic heart disease of native coronary artery without angina pectoris: Secondary | ICD-10-CM | POA: Diagnosis not present

## 2023-03-22 DIAGNOSIS — N183 Chronic kidney disease, stage 3 unspecified: Secondary | ICD-10-CM | POA: Diagnosis not present

## 2023-03-22 DIAGNOSIS — E039 Hypothyroidism, unspecified: Secondary | ICD-10-CM | POA: Diagnosis not present

## 2023-03-22 DIAGNOSIS — J40 Bronchitis, not specified as acute or chronic: Secondary | ICD-10-CM | POA: Diagnosis not present

## 2023-03-30 DIAGNOSIS — Z961 Presence of intraocular lens: Secondary | ICD-10-CM | POA: Diagnosis not present

## 2023-03-30 DIAGNOSIS — H2511 Age-related nuclear cataract, right eye: Secondary | ICD-10-CM | POA: Diagnosis not present

## 2023-03-30 DIAGNOSIS — H53143 Visual discomfort, bilateral: Secondary | ICD-10-CM | POA: Diagnosis not present

## 2023-03-30 DIAGNOSIS — Z9842 Cataract extraction status, left eye: Secondary | ICD-10-CM | POA: Diagnosis not present

## 2023-03-30 DIAGNOSIS — H524 Presbyopia: Secondary | ICD-10-CM | POA: Diagnosis not present

## 2023-07-09 ENCOUNTER — Other Ambulatory Visit: Payer: Self-pay | Admitting: Internal Medicine

## 2023-07-09 DIAGNOSIS — Z1231 Encounter for screening mammogram for malignant neoplasm of breast: Secondary | ICD-10-CM

## 2023-09-22 DIAGNOSIS — E785 Hyperlipidemia, unspecified: Secondary | ICD-10-CM | POA: Diagnosis not present

## 2023-09-22 DIAGNOSIS — N1831 Chronic kidney disease, stage 3a: Secondary | ICD-10-CM | POA: Diagnosis not present

## 2023-09-22 DIAGNOSIS — E559 Vitamin D deficiency, unspecified: Secondary | ICD-10-CM | POA: Diagnosis not present

## 2023-09-22 DIAGNOSIS — D0512 Intraductal carcinoma in situ of left breast: Secondary | ICD-10-CM | POA: Diagnosis not present

## 2023-09-22 DIAGNOSIS — Z0000 Encounter for general adult medical examination without abnormal findings: Secondary | ICD-10-CM | POA: Diagnosis not present

## 2023-09-22 DIAGNOSIS — J45909 Unspecified asthma, uncomplicated: Secondary | ICD-10-CM | POA: Diagnosis not present

## 2023-09-22 DIAGNOSIS — E039 Hypothyroidism, unspecified: Secondary | ICD-10-CM | POA: Diagnosis not present

## 2023-09-22 DIAGNOSIS — M810 Age-related osteoporosis without current pathological fracture: Secondary | ICD-10-CM | POA: Diagnosis not present

## 2023-09-22 DIAGNOSIS — Z96641 Presence of right artificial hip joint: Secondary | ICD-10-CM | POA: Diagnosis not present

## 2023-11-16 ENCOUNTER — Ambulatory Visit
Admission: RE | Admit: 2023-11-16 | Discharge: 2023-11-16 | Disposition: A | Discharge status: Home / Self Care | Payer: Medicare Other | Source: Ambulatory Visit | Attending: Internal Medicine | Admitting: Internal Medicine

## 2023-11-16 DIAGNOSIS — Z1231 Encounter for screening mammogram for malignant neoplasm of breast: Secondary | ICD-10-CM

## 2023-11-30 ENCOUNTER — Other Ambulatory Visit: Payer: Self-pay

## 2023-11-30 ENCOUNTER — Ambulatory Visit: Payer: Medicare Other | Admitting: Sports Medicine

## 2023-11-30 VITALS — BP 122/80 | Ht 62.0 in | Wt 120.0 lb

## 2023-11-30 DIAGNOSIS — M75101 Unspecified rotator cuff tear or rupture of right shoulder, not specified as traumatic: Secondary | ICD-10-CM | POA: Insufficient documentation

## 2023-11-30 DIAGNOSIS — M19011 Primary osteoarthritis, right shoulder: Secondary | ICD-10-CM | POA: Insufficient documentation

## 2023-11-30 DIAGNOSIS — M25511 Pain in right shoulder: Secondary | ICD-10-CM | POA: Diagnosis not present

## 2023-11-30 DIAGNOSIS — M25512 Pain in left shoulder: Secondary | ICD-10-CM

## 2023-11-30 DIAGNOSIS — M12811 Other specific arthropathies, not elsewhere classified, right shoulder: Secondary | ICD-10-CM | POA: Insufficient documentation

## 2023-11-30 NOTE — Progress Notes (Signed)
 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 which is easier on her shoulders She continues to stay active She swims without shoulder pain She walks and does yoga The yoga seems to improve her shoulder motion  Physical exam Pleasant female in no acute distress BP 122/80   Ht 5\' 2"  (1.575 m)   Wt 120 lb (54.4 kg)   BMI 21.95 kg/m   Patient is able to get full flexion and elevation of both shoulders No sign of scapular dysfunction on movement She has limitations of external rotation bilaterally perhaps worse on the left She seems to have good internal rotation Impingement testing shows pretty good strength as does not seem to create pain External rotation testing is strong but limited motion noted Internal rotation testing is strong in neutral position Crossover testing does not cause pain  Ultrasound of right shoulder There appears to be a complete retraction of the supraspinatus muscle back under the scapula The infraspinatus muscle also appears to be retracted and no presence on the footplate Subscapularis muscle is intact with good motion but there are number of bony irregularities of the humeral head that impacts the tendon with motion Teres minor appears intact Biceps tendon shows a fairly high-grade tear AC joint shows significant arthritis and calcification without effusion  Humeral head and glenohumeral space shows significant irregularity and arthritic  changes  Impression is rotator cuff arthropathy with complete tears of the superior cuff 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  Ultrasound of the left shoulder There appears to be retraction of the supraspinatus muscle under the acromion The infraspinatus muscle seems to be retracted as well and does not extend to the footplate Teres minor muscle appears okay Subscapularis muscle appears intact Biceps tendon shows a high-grade tear There is irregularity of the glenohumeral head and joint There is significant AC joint narrowing and calcification suggestive of arthritis  Impression; complete tears of the superior rotator cuff muscles supraspinatus and infraspinatus as well as glenohumeral arthritis and AC joint arthritis  Ultrasound and interpretation by Sibyl Parr. Darrick Penna, MD

## 2023-11-30 NOTE — Assessment & Plan Note (Signed)
 Since she is not having a lot of pain or symptoms I suggested limiting overhead activity that would load this joint and limiting things like planks which might put full body weight and pressure on this  Otherwise she will continue with her exercise program

## 2023-11-30 NOTE — Assessment & Plan Note (Signed)
 Patient has surprisingly good function of both shoulders in spite of the significant arthritis and rotator cuff tears  We gave her exercises to keep her external rotation and internal rotation strength good We will limit her to 90 degrees of elevation on strengthening Avoid overhead lifting Continue activities she can do without pain I do not think she needs medications since this is not limiting her at the present  With such good compensation I did reassure her that she would not need to do a shoulder replacement unless symptoms became more significant limited what she was able to do

## 2023-12-07 DIAGNOSIS — D1801 Hemangioma of skin and subcutaneous tissue: Secondary | ICD-10-CM | POA: Diagnosis not present

## 2023-12-07 DIAGNOSIS — L570 Actinic keratosis: Secondary | ICD-10-CM | POA: Diagnosis not present

## 2023-12-07 DIAGNOSIS — D225 Melanocytic nevi of trunk: Secondary | ICD-10-CM | POA: Diagnosis not present

## 2023-12-07 DIAGNOSIS — Z85828 Personal history of other malignant neoplasm of skin: Secondary | ICD-10-CM | POA: Diagnosis not present

## 2023-12-07 DIAGNOSIS — L821 Other seborrheic keratosis: Secondary | ICD-10-CM | POA: Diagnosis not present

## 2023-12-07 DIAGNOSIS — D485 Neoplasm of uncertain behavior of skin: Secondary | ICD-10-CM | POA: Diagnosis not present

## 2023-12-07 DIAGNOSIS — D2261 Melanocytic nevi of right upper limb, including shoulder: Secondary | ICD-10-CM | POA: Diagnosis not present

## 2024-03-29 DIAGNOSIS — M810 Age-related osteoporosis without current pathological fracture: Secondary | ICD-10-CM | POA: Diagnosis not present

## 2024-03-29 DIAGNOSIS — E039 Hypothyroidism, unspecified: Secondary | ICD-10-CM | POA: Diagnosis not present

## 2024-03-29 DIAGNOSIS — E785 Hyperlipidemia, unspecified: Secondary | ICD-10-CM | POA: Diagnosis not present

## 2024-03-29 DIAGNOSIS — R0982 Postnasal drip: Secondary | ICD-10-CM | POA: Diagnosis not present

## 2024-03-29 DIAGNOSIS — N1831 Chronic kidney disease, stage 3a: Secondary | ICD-10-CM | POA: Diagnosis not present

## 2024-03-29 DIAGNOSIS — D0512 Intraductal carcinoma in situ of left breast: Secondary | ICD-10-CM | POA: Diagnosis not present

## 2024-03-31 ENCOUNTER — Other Ambulatory Visit (HOSPITAL_BASED_OUTPATIENT_CLINIC_OR_DEPARTMENT_OTHER): Payer: Self-pay | Admitting: Internal Medicine

## 2024-03-31 DIAGNOSIS — Z13820 Encounter for screening for osteoporosis: Secondary | ICD-10-CM

## 2024-04-19 DIAGNOSIS — H43393 Other vitreous opacities, bilateral: Secondary | ICD-10-CM | POA: Diagnosis not present

## 2024-04-19 DIAGNOSIS — H2511 Age-related nuclear cataract, right eye: Secondary | ICD-10-CM | POA: Diagnosis not present

## 2024-04-19 DIAGNOSIS — H524 Presbyopia: Secondary | ICD-10-CM | POA: Diagnosis not present

## 2024-04-19 DIAGNOSIS — H04123 Dry eye syndrome of bilateral lacrimal glands: Secondary | ICD-10-CM | POA: Diagnosis not present

## 2024-05-22 DIAGNOSIS — Z85828 Personal history of other malignant neoplasm of skin: Secondary | ICD-10-CM | POA: Diagnosis not present

## 2024-05-22 DIAGNOSIS — L309 Dermatitis, unspecified: Secondary | ICD-10-CM | POA: Diagnosis not present

## 2024-05-30 ENCOUNTER — Other Ambulatory Visit: Payer: Self-pay | Admitting: Internal Medicine

## 2024-05-30 DIAGNOSIS — Z853 Personal history of malignant neoplasm of breast: Secondary | ICD-10-CM | POA: Diagnosis not present

## 2024-05-30 DIAGNOSIS — N644 Mastodynia: Secondary | ICD-10-CM

## 2024-05-30 DIAGNOSIS — L209 Atopic dermatitis, unspecified: Secondary | ICD-10-CM | POA: Diagnosis not present

## 2024-06-02 ENCOUNTER — Ambulatory Visit
Admission: RE | Admit: 2024-06-02 | Discharge: 2024-06-02 | Disposition: A | Discharge status: Home / Self Care | Source: Ambulatory Visit | Attending: Internal Medicine | Admitting: Internal Medicine

## 2024-06-02 DIAGNOSIS — N6489 Other specified disorders of breast: Secondary | ICD-10-CM | POA: Diagnosis not present

## 2024-06-02 DIAGNOSIS — N644 Mastodynia: Secondary | ICD-10-CM

## 2024-06-02 DIAGNOSIS — R928 Other abnormal and inconclusive findings on diagnostic imaging of breast: Secondary | ICD-10-CM | POA: Diagnosis not present

## 2024-07-07 DIAGNOSIS — Z0000 Encounter for general adult medical examination without abnormal findings: Secondary | ICD-10-CM | POA: Diagnosis not present

## 2024-07-07 DIAGNOSIS — Z136 Encounter for screening for cardiovascular disorders: Secondary | ICD-10-CM | POA: Diagnosis not present

## 2024-07-07 DIAGNOSIS — Z23 Encounter for immunization: Secondary | ICD-10-CM | POA: Diagnosis not present

## 2024-07-07 DIAGNOSIS — R3915 Urgency of urination: Secondary | ICD-10-CM | POA: Diagnosis not present

## 2024-07-07 DIAGNOSIS — E785 Hyperlipidemia, unspecified: Secondary | ICD-10-CM | POA: Diagnosis not present

## 2024-07-07 DIAGNOSIS — D0512 Intraductal carcinoma in situ of left breast: Secondary | ICD-10-CM | POA: Diagnosis not present

## 2024-07-07 DIAGNOSIS — E039 Hypothyroidism, unspecified: Secondary | ICD-10-CM | POA: Diagnosis not present

## 2024-07-07 DIAGNOSIS — M810 Age-related osteoporosis without current pathological fracture: Secondary | ICD-10-CM | POA: Diagnosis not present

## 2024-07-07 DIAGNOSIS — N1832 Chronic kidney disease, stage 3b: Secondary | ICD-10-CM | POA: Diagnosis not present

## 2024-08-02 DIAGNOSIS — R030 Elevated blood-pressure reading, without diagnosis of hypertension: Secondary | ICD-10-CM | POA: Diagnosis not present

## 2024-08-02 DIAGNOSIS — Z524 Kidney donor: Secondary | ICD-10-CM | POA: Diagnosis not present

## 2024-08-02 DIAGNOSIS — N1832 Chronic kidney disease, stage 3b: Secondary | ICD-10-CM | POA: Diagnosis not present

## 2024-08-02 DIAGNOSIS — Z905 Acquired absence of kidney: Secondary | ICD-10-CM | POA: Diagnosis not present
